# Patient Record
Sex: Female | Born: 1955
Health system: Southern US, Community
[De-identification: ages and names within clinical notes are randomized; demographics above are authoritative.]

## PROBLEM LIST (undated history)

## (undated) DIAGNOSIS — I10 Essential (primary) hypertension: Secondary | ICD-10-CM

## (undated) DIAGNOSIS — T7840XA Allergy, unspecified, initial encounter: Secondary | ICD-10-CM

## (undated) DIAGNOSIS — E559 Vitamin D deficiency, unspecified: Secondary | ICD-10-CM

## (undated) DIAGNOSIS — R7303 Prediabetes: Secondary | ICD-10-CM

## (undated) DIAGNOSIS — R569 Unspecified convulsions: Secondary | ICD-10-CM

## (undated) DIAGNOSIS — M1990 Unspecified osteoarthritis, unspecified site: Secondary | ICD-10-CM

## (undated) DIAGNOSIS — S069XAA Unspecified intracranial injury with loss of consciousness status unknown, initial encounter: Secondary | ICD-10-CM

## (undated) DIAGNOSIS — E785 Hyperlipidemia, unspecified: Secondary | ICD-10-CM

## (undated) DIAGNOSIS — S069X9A Unspecified intracranial injury with loss of consciousness of unspecified duration, initial encounter: Secondary | ICD-10-CM

## (undated) HISTORY — DX: Vitamin D deficiency, unspecified: E55.9

## (undated) HISTORY — DX: Essential (primary) hypertension: I10

## (undated) HISTORY — PX: ABDOMINAL HYSTERECTOMY: SHX81

## (undated) HISTORY — DX: Unspecified intracranial injury with loss of consciousness status unknown, initial encounter: S06.9XAA

## (undated) HISTORY — DX: Prediabetes: R73.03

## (undated) HISTORY — DX: Allergy, unspecified, initial encounter: T78.40XA

## (undated) HISTORY — DX: Hyperlipidemia, unspecified: E78.5

## (undated) HISTORY — DX: Unspecified osteoarthritis, unspecified site: M19.90

## (undated) HISTORY — DX: Unspecified convulsions: R56.9

## (undated) HISTORY — PX: BREAST BIOPSY: SHX20

## (undated) HISTORY — PX: KNEE ARTHROSCOPY: SUR90

## (undated) HISTORY — DX: Unspecified intracranial injury with loss of consciousness of unspecified duration, initial encounter: S06.9X9A

---

## 1998-06-21 ENCOUNTER — Ambulatory Visit (HOSPITAL_COMMUNITY): Admission: RE | Admit: 1998-06-21 | Discharge: 1998-06-21 | Payer: Self-pay | Admitting: Obstetrics and Gynecology

## 2000-02-27 ENCOUNTER — Other Ambulatory Visit: Admission: RE | Admit: 2000-02-27 | Discharge: 2000-02-27 | Payer: Self-pay | Admitting: Obstetrics and Gynecology

## 2000-03-05 ENCOUNTER — Encounter: Payer: Self-pay | Admitting: Obstetrics and Gynecology

## 2000-03-05 ENCOUNTER — Encounter: Admission: RE | Admit: 2000-03-05 | Discharge: 2000-03-05 | Payer: Self-pay | Admitting: Obstetrics and Gynecology

## 2002-09-08 ENCOUNTER — Encounter: Payer: Self-pay | Admitting: Gynecology

## 2002-09-08 ENCOUNTER — Encounter: Admission: RE | Admit: 2002-09-08 | Discharge: 2002-09-08 | Payer: Self-pay | Admitting: Gynecology

## 2002-09-18 ENCOUNTER — Encounter: Payer: Self-pay | Admitting: Gynecology

## 2002-09-18 ENCOUNTER — Encounter: Admission: RE | Admit: 2002-09-18 | Discharge: 2002-09-18 | Payer: Self-pay | Admitting: Gynecology

## 2002-09-22 ENCOUNTER — Other Ambulatory Visit: Admission: RE | Admit: 2002-09-22 | Discharge: 2002-09-22 | Payer: Self-pay | Admitting: Gynecology

## 2003-01-20 ENCOUNTER — Encounter: Payer: Self-pay | Admitting: Gynecology

## 2003-01-20 ENCOUNTER — Encounter: Admission: RE | Admit: 2003-01-20 | Discharge: 2003-01-20 | Payer: Self-pay | Admitting: Gynecology

## 2003-02-22 ENCOUNTER — Encounter: Admission: RE | Admit: 2003-02-22 | Discharge: 2003-02-22 | Payer: Self-pay | Admitting: Neurology

## 2004-02-15 ENCOUNTER — Other Ambulatory Visit: Admission: RE | Admit: 2004-02-15 | Discharge: 2004-02-15 | Payer: Self-pay | Admitting: Gynecology

## 2004-02-16 ENCOUNTER — Encounter: Admission: RE | Admit: 2004-02-16 | Discharge: 2004-02-16 | Payer: Self-pay | Admitting: Gynecology

## 2005-06-05 ENCOUNTER — Other Ambulatory Visit: Admission: RE | Admit: 2005-06-05 | Discharge: 2005-06-05 | Payer: Self-pay | Admitting: Gynecology

## 2005-06-12 ENCOUNTER — Encounter: Admission: RE | Admit: 2005-06-12 | Discharge: 2005-06-12 | Payer: Self-pay | Admitting: Gynecology

## 2005-07-11 ENCOUNTER — Encounter (INDEPENDENT_AMBULATORY_CARE_PROVIDER_SITE_OTHER): Payer: Self-pay | Admitting: Specialist

## 2005-07-11 ENCOUNTER — Inpatient Hospital Stay (HOSPITAL_COMMUNITY): Admission: RE | Admit: 2005-07-11 | Discharge: 2005-07-13 | Payer: Self-pay | Admitting: Gynecology

## 2006-04-09 ENCOUNTER — Encounter: Admission: RE | Admit: 2006-04-09 | Discharge: 2006-04-09 | Payer: Self-pay | Admitting: Gynecology

## 2006-04-24 ENCOUNTER — Encounter: Admission: RE | Admit: 2006-04-24 | Discharge: 2006-04-24 | Payer: Self-pay | Admitting: Gynecology

## 2006-05-03 ENCOUNTER — Encounter: Admission: RE | Admit: 2006-05-03 | Discharge: 2006-05-03 | Payer: Self-pay | Admitting: Gynecology

## 2006-08-14 ENCOUNTER — Ambulatory Visit (HOSPITAL_BASED_OUTPATIENT_CLINIC_OR_DEPARTMENT_OTHER): Admission: RE | Admit: 2006-08-14 | Discharge: 2006-08-14 | Payer: Self-pay | Admitting: Surgery

## 2006-08-14 ENCOUNTER — Emergency Department (HOSPITAL_COMMUNITY): Admission: EM | Admit: 2006-08-14 | Discharge: 2006-08-14 | Payer: Self-pay | Admitting: Emergency Medicine

## 2006-08-14 ENCOUNTER — Encounter (INDEPENDENT_AMBULATORY_CARE_PROVIDER_SITE_OTHER): Payer: Self-pay | Admitting: Specialist

## 2006-10-15 LAB — HM COLONOSCOPY

## 2007-01-08 ENCOUNTER — Other Ambulatory Visit: Admission: RE | Admit: 2007-01-08 | Discharge: 2007-01-08 | Payer: Self-pay | Admitting: Gynecology

## 2007-01-14 ENCOUNTER — Encounter: Admission: RE | Admit: 2007-01-14 | Discharge: 2007-01-14 | Payer: Self-pay | Admitting: Gynecology

## 2008-01-13 HISTORY — PX: COLONOSCOPY: SHX174

## 2008-02-05 ENCOUNTER — Encounter: Admission: RE | Admit: 2008-02-05 | Discharge: 2008-02-05 | Payer: Self-pay | Admitting: Gynecology

## 2008-07-30 ENCOUNTER — Encounter: Admission: RE | Admit: 2008-07-30 | Discharge: 2008-07-30 | Payer: Self-pay | Admitting: Gynecology

## 2009-02-09 ENCOUNTER — Encounter: Admission: RE | Admit: 2009-02-09 | Discharge: 2009-02-09 | Payer: Self-pay | Admitting: Gynecology

## 2009-02-09 ENCOUNTER — Encounter (INDEPENDENT_AMBULATORY_CARE_PROVIDER_SITE_OTHER): Payer: Self-pay | Admitting: Diagnostic Radiology

## 2010-02-28 ENCOUNTER — Encounter: Admission: RE | Admit: 2010-02-28 | Discharge: 2010-02-28 | Payer: Self-pay | Admitting: Internal Medicine

## 2010-11-05 ENCOUNTER — Encounter: Payer: Self-pay | Admitting: Gynecology

## 2011-03-02 NOTE — Op Note (Signed)
[name], [name]               ACCOUNT NO.:  [id number]   MEDICAL RECORD NO.:  [id number]          PATIENT TYPE:  AMB   LOCATION:  DSC                          FACILITY:  [facility]   PHYSICIAN:  Currie Paris, M.D.DATE OF BIRTH:  1956/07/17   DATE OF PROCEDURE:  08/14/2006  DATE OF DISCHARGE:                                 OPERATIVE REPORT   PREOPERATIVE DIAGNOSIS:  Bloody left nipple discharge.   POSTOPERATIVE DIAGNOSIS:  Bloody left nipple discharge.   OPERATION:  Left breast ductal excision.   SURGEON:  Currie Paris, M.D.   ANESTHESIA:  General.   CLINICAL HISTORY:  [name] is a 55 year old lady with a spontaneous  bloody left nipple discharge that came from a duct at about the 9 o'clock  position.  Attempts at ductography were unsuccessful.  It was elected to do  ductal excision to obtain tissue for diagnosis.   DESCRIPTION OF PROCEDURE:  The patient was seen in the holding area and she  had no further questions.  We both marked the left breast as the operative  side.  The patient was taken to the operating room and after satisfactory  general anesthesia had been obtained, the left breast was prepped and  draped.  The time-out occurred.  I was able to get a 0 size probe into the  duct producing the bloody drainage and then a 1 and then a 2 to dilate it a  little bit.  There seemed to be a little bit of obstruction with the two  larger ones just under the nipple proper.  I then put a 23 angiocath in and  injected some methylene blue to try to stain the ductal tissue.   Following this, a curvilinear incision was made in the areolar margin  medially.  I raised a skin flap over to the duct and disconnected the blue  stained duct from the nipple.  Right at that point, there did appeared to be  a little bit of tissue coming out the duct consistent with a tiny papilloma.  I made sure we excised all of this and then the blue dye seemed to track a  little bit deep  medial and inferior so I took the breast tissue that was  stained with blue dye out to complete the excision of this ductal system.  In disconnecting the ducts, we did apparently get all the ducts disconnected  from the nipple.   Once I made sure everything was dry, I injected Marcaine to help with postop  pain relief.  I then closed the breast in layers with 3-0 Vicryl, 4-0  Monocryl subcuticular, and Dermabond.  The patient tolerated the procedure  well.  There were no operative complications.  All counts were correct.      Currie Paris, M.D.  Electronically Signed     CJS/MEDQ  D:  08/14/2006  T:  08/14/2006  Job:  [id number]   cc:   Gretta Cool, M.D.

## 2011-03-02 NOTE — Op Note (Signed)
NAMEALIXANDRA, Horton               ACCOUNT NO.:  [id number]   MEDICAL RECORD NO.:  [id number]          PATIENT TYPE:  INP   LOCATION:  9309                          FACILITY:  [facility]   PHYSICIAN:  Gretta Cool, M.D. DATE OF BIRTH:  October 28, 1955   DATE OF PROCEDURE:  07/11/2005  DATE OF DISCHARGE:                                 OPERATIVE REPORT   PREOPERATIVE DIAGNOSIS:  Massive uterine leiomyomata with abnormal uterine  bleeding.   POSTOPERATIVE DIAGNOSIS:  Massive uterine leiomyomata with abnormal uterine  bleeding.   PROCEDURE:  Supracervical hysterectomy and bilateral salpingo-oophorectomy.   SURGEON:  Dr. Nicholas Lose.   ASSISTANT:  Dr. Jeanine Luz.   ANESTHESIA:  General orotracheal.   DESCRIPTION OF PROCEDURE:  Under excellent general anesthesia with the  patient's abdomen prepped and draped in lithotomy position with PAS  stockings in place, a Pfannenstiel incision was made by excision of her  previous scar.  The incision was then extended through the fascia and the  rectus muscle separated from the fascia, the peritoneum opened and the  abdomen explored.  There are no abnormalities in the upper abdomen  identified.  Examination of the uterus revealed an enormous fibroid-  containing uterus that filled the pelvis, rising up to the umbilicus.  There  were enormous numbers of fibroids extending transmural and pedunculated  fibroids.  The retractors and packs were placed so as to expose the uterus  as well as possible.  The round ligaments were then transected.  The  infundibulopelvic ligaments were then isolated, clamped, cut, sutured, and  tied with 0 Vicryl.  A second free tie was used to double ligate the  infundibulopelvic.  At this point, the uterine vessels were skeletonized.  The uterus was then delivered with a Lahey thyroid clamp through the uterine  incision.  The bladder flap was then incised and the bladder pushed off the  lower segment.  The uterine vessels were  then further skeletonized, then  clamped, cut, sutured, and tied with 0 Vicryl.  The upper portion of  cardinal ligament was then also clamped across, cut, tied, sutured and tied.  At this point, the cervix was incised so as to remove virtually all of the  endocervical canal all the way to the vagina.  The fascia of the cervix was  left intact.  At this point, the cervical fascia was approximated with a  running suture of #0 Vicryl from transversely.  At this point, careful  examination revealed some residual bleeding of adventitial areas in the left  parametrium.  Those were treated by cautery and controlled.  At this point,  the pelvic floor was reperitonealized with a 2-0 chronic catgut.  The packs  and retractors were then removed, and the pelvis was irrigated.  The omentum  was pulled down over the pelvic organs and the abdominal peritoneum closed  with a running suture of #0 Monocryl.  The rectus muscles were then plicated  in the midline with the same suture of 0 Monocryl.  Fascia was then  approximated from each angle to the midline with 0 Vicryl.  The  subcutaneous  tissue was then freed from Scarpa's attachment to the previous old scar so  as to prevent increasingly  severe indentation in the incision.  The subcu was mobilized then closed  with interrupted sutures of 3-0 Vicryl.  Skin was then closed with skin  staples and Steri-Strips.  At the end of the procedure, sponge and lap  counts were correct.  There were no complications.  The patient returned to  the recovery room in excellent condition.           ______________________________  Gretta Cool, M.D.     CWL/MEDQ  D:  07/12/2005  T:  07/12/2005  Job:  [id number]   cc:   Almedia Balls. Randell Patient, M.D.  Fax: [phone]

## 2011-03-02 NOTE — Discharge Summary (Signed)
[name], [name]               ACCOUNT NO.:  [id number]   MEDICAL RECORD NO.:  [id number]          PATIENT TYPE:  INP   LOCATION:  9309                          [facility]ACILITY:  [facility]   PHYSICIAN:  Gretta Cool, M.D. DATE OF BIRTH:  [birth year]   DATE OF ADMISSION:  07/11/2005  DATE OF DISCHARGE:  07/13/2005                                 DISCHARGE SUMMARY   HISTORY OF PRESENT ILLNESS:  [name] is a 55 year old female gravida 2,  para 1, who is having increasingly severe pelvic pressure with discomfort  from an enlarging uterus.  She reports frequency and urgency to void and  nocturia.  She also has prolonged bleeding with her menses. The uterus has  now grown to the size of a 20 week pregnancy at the umbilicus.  This is a  dramatic increase since her last examination.  She has visible distention of  the lower abdomen.  Options were discussed with the patient and she wishes  to proceed with definitive therapy by hysterectomy and salpingo-  oophorectomy.   PHYSICAL EXAMINATION:  On admission her chest was clear to auscultation and  percussion.  Heart rate and rhythm were regular without murmur, gallop or  cardiac enlargement.  Abdomen large palpable mass extending from the  symphysis to the umbilicus with distention of the lower abdomen.  There are  no other pelvic masses palpable.  Pelvic examination: External genitalia  within normal limits for female.  Vagina clean and rugose with excellent  support.  Cervix is nulliparous in appearance, pulled high into the pelvis.  There is a dramatic lobular knobby uterus with multiple fibroids palpable.  It fills the entire pelvis and rises to the umbilicus.  Adnexa nonpalpable.  Rectovaginal examination confirms.   IMPRESSION:  1.  Uterine leiomyomata with massive enlargement.  2.  Abnormal uterine bleeding.  3.  Pelvic pressure with incontinence.  4.  Perimenopausal.   PLAN:  Supracervical hysterectomy, bilateral salpingo-oophorectomy  under  general anesthesia.  Risks and benefits were discussed with the patient and  she accepts these procedures.   LABORATORY DATA:  Admission hemoglobin 13.3, hematocrit 39.3.  On the first  postoperative day hemoglobin was 12.6, hematocrit 37.2.  Pathology report  revealed (1) endometrium proliferative with benign endometrial polyps, no  hyperplasia or carcinoma identified; adenomyosis, leiomyomata submucosal,  intramural and subserosal; right and left ovary benign follicular cyst; no  endometriosis or malignancy noted; benign right and left tubes.   HOSPITAL COURSE:  The patient underwent supracervical hysterectomy and  bilateral salpingo-oophorectomy under general anesthesia.  The procedures  were completed without any complications and the patient was returned to the  recovery room in excellent condition.  Her postoperative course was without  complications and she was discharged on the second postoperative day in  excellent condition.   FINAL DISCHARGE INSTRUCTIONS:  Included no heavy lifting or straining, no  vaginal entrance, increase ambulation as tolerated.  She is to call if any  fever over 100.5 or failure of daily improvement.   DIET:  Regular.   MEDICATIONS:  1.  Tylox one tablet p.o. q.4h. PRN  pain.  2.  Celebrex 200 mg daily X5 days.   FOLLOW UP:  She is to return to the office in one week for follow up.   CONDITION ON DISCHARGE:  Excellent.   FINAL DISCHARGE DIAGNOSES:  1.  Massive uterine leiomyomata with abnormal bleeding.  2.  Adenomyosis.  3.  The leiomyomata were submucosal, intramural and subserosal.   PROCEDURES PERFORMED:  Supracervical hysterectomy, bilateral salpingo-  oophorectomy under general anesthesia.      Matt Holmes, N.P.    ______________________________  Gretta Cool, M.D.    EMK/MEDQ  D:  09/05/2005  T:  09/05/2005  Job:  [id number]

## 2011-03-02 NOTE — H&P (Signed)
[name], [name]               ACCOUNT NO.:  [id number]   MEDICAL RECORD NO.:  [id number]          PATIENT TYPE:  INP   LOCATION:  9309                          FACILITY:  [facility]   PHYSICIAN:  Gretta Cool, M.D. DATE OF BIRTH:  05-19-1956   DATE OF ADMISSION:  07/11/2005  DATE OF DISCHARGE:                                HISTORY & PHYSICAL   CHIEF COMPLAINT:  1.  Abnormal uterine bleeding from fibroid uterus.  2.  Perimenopausal night sweats, hot flushes, skips of menses.   HISTORY OF PRESENT ILLNESS:  This 55 year old gravida 2, para 1 who is not  sexually active but having increasing difficulty with pelvic pressure,  discomfort enlarging uterus now off to the umbilicus with frequency and  urgency to void, nocturia also having erratic menses with prolonged flow  varying to no bleeding.  She now has a uterus that has grown to the  umbilicus. It has increased dramatically since her exam last.  She has  visible distension of her lower abdomen.  We have discussed options and  alternatives all.  She wishes to proceed to definitive therapy by  hysterectomy and salpingo-oophorectomy.  She understands the risks and  benefits of removal of the ovaries, options and all.   PAST MEDICAL HISTORY:  Usual childhood disease without sequelae.   MEDICAL ILLNESSES:  History of D&C by Dr. Katrinka Blazing for abnormal uterine  bleeding.  History of cesarean section, childbirth, by Dr. Jennette Kettle in 1987.  No  hospitalizations.   PRESENT MEDICATIONS:  None.   ALLERGIES:  None known.   FAMILY HISTORY:  Brother died of metastatic cancer, unknown primary.  One  brother has renal failure. Mother has diabetes type 2 and hypertension.  No  other known familial tendency.   SOCIAL HISTORY:  The patient is divorced and not in a relationship now.  She  has one child living at home, age 55.   REVIEW OF SYSTEMS:  HEENT: Denies symptoms.  CARDIORESPIRATORY:  Denies any cough or shortness of breath.  GASTROINTESTINAL/GENITOURINARY:  Denies frequency, urgency, dysuria, change  in bowel habits, or food intolerance.   PHYSICAL EXAMINATION:  GENERAL:  Well-developed, short black female.  HEENT: Pupils equal, react to light and accommodation.  Fundi benign.  Oropharynx clear.  NECK:  Supple without masses or thyroid enlargement.  CHEST:  Clear to percussion and auscultation.  BREASTS:  Without mass, nodes or nipple discharge.  HEART:  Regular rhythm without murmur or cardiac enlargement.  ABDOMEN:  Large, palpable mass extending from symphysis to the umbilicus  with distension of her lower abdomen.  There are no other pelvic masses or  abdominal masses palpable.  PELVIC:  External genitalia normal female.  Vagina clean, rugose with  excellent nulliparous support.  Cervix is nulliparous in appearance pulled  up high out of the pelvis.  She has a dramatically lobular, knobby uterus  with multiple fibroids palpable in all planes. It fills her entire pelvis  and rises to the umbilicus.  Adnexa nonpalpable.  Rectovaginal exam  confirms.  EXTREMITIES:  Negative.  NEUROLOGICAL:  Physiologic.   IMPRESSION:  1.  Uterine leiomyomata with massive enlargement.  2.  Abnormal uterine bleeding.  3.  Perimenopausal.   PLAN:  Supracervical hysterectomy, bilateral salpingo-oophorectomy.           ______________________________  Gretta Cool, M.D.     CWL/MEDQ  D:  07/12/2005  T:  07/12/2005  Job:  [id number]

## 2011-06-06 ENCOUNTER — Other Ambulatory Visit: Payer: Self-pay | Admitting: Internal Medicine

## 2011-06-06 DIAGNOSIS — Z1231 Encounter for screening mammogram for malignant neoplasm of breast: Secondary | ICD-10-CM

## 2011-06-08 ENCOUNTER — Ambulatory Visit
Admission: RE | Admit: 2011-06-08 | Discharge: 2011-06-08 | Disposition: A | Discharge status: Home / Self Care | Payer: BC Managed Care – PPO | Source: Ambulatory Visit | Attending: Internal Medicine | Admitting: Internal Medicine

## 2011-06-08 DIAGNOSIS — Z1231 Encounter for screening mammogram for malignant neoplasm of breast: Secondary | ICD-10-CM

## 2011-06-26 ENCOUNTER — Other Ambulatory Visit: Payer: Self-pay | Admitting: Gynecology

## 2012-02-29 ENCOUNTER — Other Ambulatory Visit: Payer: Self-pay | Admitting: Gynecology

## 2012-05-12 ENCOUNTER — Other Ambulatory Visit: Payer: Self-pay | Admitting: Gynecology

## 2012-05-12 DIAGNOSIS — Z1231 Encounter for screening mammogram for malignant neoplasm of breast: Secondary | ICD-10-CM

## 2012-06-09 ENCOUNTER — Ambulatory Visit
Admission: RE | Admit: 2012-06-09 | Discharge: 2012-06-09 | Disposition: A | Discharge status: Home / Self Care | Payer: BC Managed Care – PPO | Source: Ambulatory Visit | Attending: Gynecology | Admitting: Gynecology

## 2012-06-09 DIAGNOSIS — Z1231 Encounter for screening mammogram for malignant neoplasm of breast: Secondary | ICD-10-CM

## 2013-05-06 ENCOUNTER — Other Ambulatory Visit: Payer: Self-pay

## 2013-05-06 DIAGNOSIS — Z1231 Encounter for screening mammogram for malignant neoplasm of breast: Secondary | ICD-10-CM

## 2013-06-10 ENCOUNTER — Ambulatory Visit
Admission: RE | Admit: 2013-06-10 | Discharge: 2013-06-10 | Disposition: A | Discharge status: Home / Self Care | Payer: BC Managed Care – PPO | Source: Ambulatory Visit

## 2013-06-10 DIAGNOSIS — Z1231 Encounter for screening mammogram for malignant neoplasm of breast: Secondary | ICD-10-CM

## 2013-06-10 LAB — HM MAMMOGRAPHY: HM Mammogram: NORMAL

## 2013-09-16 ENCOUNTER — Other Ambulatory Visit: Payer: Self-pay | Admitting: Physician Assistant

## 2013-10-04 DIAGNOSIS — I10 Essential (primary) hypertension: Secondary | ICD-10-CM | POA: Insufficient documentation

## 2013-10-04 DIAGNOSIS — E785 Hyperlipidemia, unspecified: Secondary | ICD-10-CM | POA: Insufficient documentation

## 2013-10-04 DIAGNOSIS — E559 Vitamin D deficiency, unspecified: Secondary | ICD-10-CM | POA: Insufficient documentation

## 2013-10-04 DIAGNOSIS — S069X9A Unspecified intracranial injury with loss of consciousness of unspecified duration, initial encounter: Secondary | ICD-10-CM | POA: Insufficient documentation

## 2013-10-04 DIAGNOSIS — S069XAA Unspecified intracranial injury with loss of consciousness status unknown, initial encounter: Secondary | ICD-10-CM | POA: Insufficient documentation

## 2013-10-06 ENCOUNTER — Encounter: Payer: Self-pay | Admitting: Physician Assistant

## 2013-10-06 ENCOUNTER — Ambulatory Visit (INDEPENDENT_AMBULATORY_CARE_PROVIDER_SITE_OTHER): Payer: Self-pay | Admitting: Physician Assistant

## 2013-10-06 VITALS — BP 122/78 | HR 76 | Temp 98.2°F | Resp 16 | Ht 62.0 in | Wt 137.0 lb

## 2013-10-06 DIAGNOSIS — I10 Essential (primary) hypertension: Secondary | ICD-10-CM

## 2013-10-06 DIAGNOSIS — E559 Vitamin D deficiency, unspecified: Secondary | ICD-10-CM

## 2013-10-06 DIAGNOSIS — Z0000 Encounter for general adult medical examination without abnormal findings: Secondary | ICD-10-CM

## 2013-10-06 DIAGNOSIS — Z79899 Other long term (current) drug therapy: Secondary | ICD-10-CM

## 2013-10-06 DIAGNOSIS — R109 Unspecified abdominal pain: Secondary | ICD-10-CM

## 2013-10-06 DIAGNOSIS — H6120 Impacted cerumen, unspecified ear: Secondary | ICD-10-CM

## 2013-10-06 DIAGNOSIS — E785 Hyperlipidemia, unspecified: Secondary | ICD-10-CM

## 2013-10-06 DIAGNOSIS — H6121 Impacted cerumen, right ear: Secondary | ICD-10-CM

## 2013-10-06 LAB — CBC WITH DIFFERENTIAL/PLATELET
Basophils Absolute: 0 10*3/uL (ref 0.0–0.1)
Basophils Relative: 0 % (ref 0–1)
HCT: 41.1 % (ref 36.0–46.0)
Lymphocytes Relative: 44 % (ref 12–46)
MCHC: 33.3 g/dL (ref 30.0–36.0)
Neutro Abs: 1.2 10*3/uL — ABNORMAL LOW (ref 1.7–7.7)
Neutrophils Relative %: 43 % (ref 43–77)
Platelets: 167 10*3/uL (ref 150–400)
RDW: 13.1 % (ref 11.5–15.5)
WBC: 2.9 10*3/uL — ABNORMAL LOW (ref 4.0–10.5)

## 2013-10-06 LAB — HEPATIC FUNCTION PANEL
Alkaline Phosphatase: 87 U/L (ref 39–117)
Indirect Bilirubin: 0.7 mg/dL (ref 0.0–0.9)
Total Bilirubin: 0.9 mg/dL (ref 0.3–1.2)
Total Protein: 7.8 g/dL (ref 6.0–8.3)

## 2013-10-06 LAB — LIPID PANEL
HDL: 72 mg/dL (ref >39)
LDL Cholesterol: 84 mg/dL (ref 0–99)
Total CHOL/HDL Ratio: 2.3 Ratio
Triglycerides: 58 mg/dL (ref <150)

## 2013-10-06 LAB — IRON AND TIBC: %SAT: 39 % (ref 20–55)

## 2013-10-06 LAB — BASIC METABOLIC PANEL WITH GFR
BUN: 17 mg/dL (ref 6–23)
Chloride: 102 mEq/L (ref 96–112)
GFR, Est African American: >89 mL/min
GFR, Est Non African American: >89 mL/min
Potassium: 3.9 mEq/L (ref 3.5–5.3)
Sodium: 141 mEq/L (ref 135–145)

## 2013-10-06 LAB — TSH: TSH: 1.483 u[IU]/mL (ref 0.350–4.500)

## 2013-10-06 LAB — FERRITIN: Ferritin: 177 ng/mL (ref 10–291)

## 2013-10-06 NOTE — Patient Instructions (Addendum)
VAGINAL DRYNESS OVERVIEW  Vaginal dryness, also known as atrophic vaginitis, is a common condition in postmenopausal women. This condition is also common in women who have had both ovaries removed at the time of hysterectomy.   Some women have uncomfortable symptoms of vaginal dryness, such as pain with sex, burning vaginal discomfort or itching, or abnormal vaginal discharge, while others have no symptoms at all.  VAGINAL DRYNESS CAUSES   Estrogen helps to keep the vagina moist and to maintain thickness of the vaginal lining. Vaginal dryness occurs when the ovaries produce a decreased amount of estrogen. This can occur at certain times in a woman's life, and may be permanent or temporary. Times when less estrogen is made include: ?At the time of menopause. ?After surgical removal of the ovaries, chemotherapy, or radiation therapy of the pelvis for cancer. ?After having a baby, particularly in women who breastfeed. ?While using certain medications, such as danazol, medroxyprogesterone (brand names: Provera or DepoProvera), leuprolide (brand name: Lupron), or nafarelin. When these medications are stopped, estrogen production resumes.  Women who smoke cigarettes have been shown to have an increased risk of an earlier menopause transition as compared to non-smokers. Therefore, atrophic vaginitis symptoms may appear at a younger age in this population.  VAGINAL DRYNESS TREATMENT   There are three treatment options for women with vaginal dryness:  Vaginal lubricants and moisturizers - Vaginal lubricants and moisturizers can be purchased without a prescription. These products do not contain any hormones and have virtually no side effects. - Albolene is found in the facial cleanser section at [facility], [facility], or [employer]. It is a large jar with a blue top. This is the best lubricant for women because it is hypoallergenic. -Natural lubricants, such as olive, avocado or peanut oil, are easily available  products that may be used as a lubricant with sex.  -Vaginal moisturizes (eg, Replens, Moist Again, Vagisil, K-Y Silk-E, and Feminease) are formulated to allow water to be retained in the vaginal tissues. Moisturizers are applied into the vagina three times weekly to allow a continued moisturizing effect. These should not be used just before having sex, as they can be irritating.  Vaginal estrogen - Vaginal estrogen is the most effective treatment option for women with vaginal dryness. Vaginal estrogen must be prescribed by a healthcare provider. Very low doses of vaginal estrogen can be used when it is put into the vagina to treat vaginal dryness. A small amount of estrogen is absorbed into the bloodstream, but only about 100 times less than when using estrogen pills or tablets. As a result, there is a much lower risk of side effects, such as blood clots, breast cancer, and heart attack, compared with other estrogen-containing products (birth control pills, menopausal hormone therapy).  Ospemifene - Ospemifene is a prescription medication that is similar to estrogen, but is not estrogen. In the vaginal tissue, it acts similarly to estrogen. In the breast tissue, it acts as an estrogen blocker. It comes in a pill, and is prescribed for women who want to use an estrogen-like medication for vaginal dryness or painful sex associated with vaginal dryness, but prefer not to use a vaginal medication. The medication may cause hot flashes as a side effect. This type of medication may increase the risk of blood clots or uterine cancer. Further study of ospemifene is needed to evaluate the risk of these complications. This medication has not been tested in women who have had breast cancer or are at a high risk of developing breast  cancer.   Sexual activity - Vaginal estrogen improves vaginal dryness quickly, usually within a few weeks. You may continue to have sex as you treat vaginal dryness because sex itself can  help to keep the vaginal tissues healthy. Vaginal intercourse may help the vaginal tissues by keeping them soft and stretchable and preventing the tissues from shrinking.  If sex continues to be painful despite treatment for vaginal dryness, talk to your healthcare provider.     Bad carbs also include fruit juice, alcohol, and sweet tea. These are empty calories that do not signal to your brain that you are full.   Please remember the good carbs are still carbs which convert into sugar. So please measure them out no more than 1/2-1 cup of rice, oatmeal, pasta, and beans.  Veggies are however free foods! Pile them on.   I like lean protein at every meal such as chicken, [place], pork chops, cottage cheese, etc. Just do not fry these meats and please center your meal around vegetable, the meats should be a side dish.   No all fruit is created equal. Please see the list below, the fruit at the bottom is higher in sugars than the fruit at the top   Cholesterol Cholesterol is a white, waxy, fat-like protein needed by your body in small amounts. The liver makes all the cholesterol you need. It is carried from the liver by the blood through the blood vessels. Deposits (plaque) may build up on blood vessel walls. This makes the arteries narrower and stiffer. Plaque increases the risk for heart attack and stroke. You cannot feel your cholesterol level even if it is very high. The only way to know is by a blood test to check your lipid (fats) levels. Once you know your cholesterol levels, you should keep a record of the test results. Work with your caregiver to to keep your levels in the desired range. WHAT THE RESULTS MEAN:  Total cholesterol is a rough measure of all the cholesterol in your blood.  LDL is the so-called bad cholesterol. This is the type that deposits cholesterol in the walls of the arteries. You want this level to be low.  HDL is the good cholesterol because it cleans the arteries and  carries the LDL away. You want this level to be high.  Triglycerides are fat that the body can either burn for energy or store. High levels are closely linked to heart disease. DESIRED LEVELS:  Total cholesterol below 200.  LDL below 100 for people at risk, below 70 for very high risk.  HDL above 50 is good, above 60 is best.  Triglycerides below 150. HOW TO LOWER YOUR CHOLESTEROL:  Diet.  Choose fish or white meat chicken and [place], roasted or baked. Limit fatty cuts of red meat, fried foods, and processed meats, such as sausage and lunch meat.  Eat lots of fresh fruits and vegetables. Choose whole grains, beans, pasta, potatoes and cereals.  Use only small amounts of olive, corn or canola oils. Avoid butter, mayonnaise, shortening or palm kernel oils. Avoid foods with trans-fats.  Use skim/nonfat milk and low-fat/nonfat yogurt and cheeses. Avoid whole milk, cream, ice cream, egg yolks and cheeses. Healthy desserts include angel food cake, ginger snaps, animal crackers, hard candy, popsicles, and low-fat/nonfat frozen yogurt. Avoid pastries, cakes, pies and cookies.  Exercise.  A regular program helps decrease LDL and raises HDL.  Helps with weight control.  Do things that increase your activity level like gardening, walking,  or taking the stairs.  Medication.  May be prescribed by your caregiver to help lowering cholesterol and the risk for heart disease.  You may need medicine even if your levels are normal if you have several risk factors. HOME CARE INSTRUCTIONS   Follow your diet and exercise programs as suggested by your caregiver.  Take medications as directed.  Have blood work done when your caregiver feels it is necessary. MAKE SURE YOU:   Understand these instructions.  Will watch your condition.  Will get help right away if you are not doing well or get worse. Document Released: 06/26/2001 Document Revised: 12/24/2011 Document Reviewed:  12/17/2007 [facility] Patient Information 2014 [place], [place].

## 2013-10-06 NOTE — Progress Notes (Signed)
Complete Physical HPI Patient presents for complete physical.   Patient's blood pressure has been controlled at home. Patient denies chest pain, shortness of breath, dizziness. BP: 122/78 mmHg  She states intermittent palps/double beats, in the past month, last seconds, nonexertional/random, no accompaniments.  Patient's cholesterol is diet controlled.  The patient's cholesterol last visit was LDL was 79.  The patient has been working on diet and exercise for prediabetes, denies changes in vision, polys, and paresthesias. Last A1C in office was 6.3.  Vitamin D was 82.  Patient states she has painful intercourse and states she was treated for vaginal atrophy in the past. She also states she has some abdominal cramping during intercourse and intermittently at her umbilicus.  She has full feeling in her right ear and decreased hearing.  Current Medications:  Current Outpatient Prescriptions on File Prior to Visit  Medication Sig Dispense Refill  . Cholecalciferol (VITAMIN D PO) Take 5,000 Int'l Units by mouth daily.      . Cyanocobalamin (B-12 PO) Take 2,500 mcg by mouth daily.      Marland Kitchen losartan-hydrochlorothiazide (HYZAAR) 100-12.5 MG per tablet take 1 tablet by mouth once daily  30 tablet  1  . MAGNESIUM PO Take 250 mg by mouth daily.       No current facility-administered medications on file prior to visit.   Health Maintenance:  Tetanus: 2012 Pneumovax: 2013 Flu vaccine: 07/16/2013 Zostavax: N/A Pap: 2013 normal- due 3 years MGM: 05/2013 normal DEXA: NA Colonoscopy: 2008 due 10 years EGD: N/A  Allergies: No Known Allergies Medical History:  Past Medical History  Diagnosis Date  . Hyperlipidemia   . Hypertension   . Prediabetes   . Vitamin D deficiency   . TBI (traumatic brain injury)     Memory issues    Surgical History:  Past Surgical History  Procedure Laterality Date  . Abdominal hysterectomy      BSO  . Knee arthroscopy Right    Family History:  Family History   Problem Relation Age of Onset  . Hypertension Mother   . Diabetes Mother   . Heart disease Father    Social History:  History   Social History  . Marital Status: Divorced    Spouse Name: N/A    Number of Children: N/A  . Years of Education: N/A   Occupational History  . Not on file.   Social History Main Topics  . Smoking status: Never Smoker   . Smokeless tobacco: Never Used  . Alcohol Use: No  . Drug Use: No  . Sexual Activity: Not on file   Other Topics Concern  . Not on file   Social History Narrative  . No narrative on file   ROS Constitutional: Denies weight loss/gain, headaches, insomnia, fatigue, night sweats, and change in appetite. Eyes: Bifocals Sept 2014 neg glacoma Denies redness, blurred vision, diplopia, discharge, itchy, watery eyes.  ENT: Denies discharge, congestion, post nasal drip, sore throat, earache, hearing loss, dental pain, Tinnitus, Vertigo, Sinus pain, snoring.  Cardio: + palpitations  Denies chest pain, irregular heartbeat, dyspnea, diaphoresis, orthopnea, PND, claudication, edema Respiratory: denies cough, dyspnea, pleurisy, hoarseness, wheezing.  Gastrointestinal: (Medoff) Denies dysphagia, heartburn, pain, cramps, nausea, vomiting, bloating, diarrhea, constipation, hematemesis, melena, hematochezia, hemorrhoids Genitourinary: ([place]) Denies dysuria, frequency, urgency, nocturia, hesitancy, discharge, hematuria, flank pain Breast: Denies Breast lumps, nipple discharge, bleeding.  Musculoskeletal: Denies arthralgia, myalgia, stiffness, Jt. Swelling, pain, Skin: Denies pruritis, rash, hives,  acne, eczema, changing in skin lesion Neuro: Hyacinth Meeker) Denies Weakness, tremor,  incoordination, spasms, paresthesia, pain Psychiatric: Denies confusion, memory loss, sensory loss Endocrine: Denies change in weight, skin, hair change, nocturia, and paresthesia, Diabetic Denies Polys, visual blurring, hyper /hypo glycemic episodes.  Heme/Lymph: Denies  Excessive bleeding, bruising, enlarged lymph nodes  Physical Exam: Estimated body mass index is 25.05 kg/(m^2) as calculated from the following:   Height as of this encounter: 5\' 2"  (1.575 m).   Weight as of this encounter: 137 lb (62.143 kg). Filed Vitals:   10/06/13 1016  BP: 122/78  Pulse: 76  Temp: 98.2 F (36.8 C)  Resp: 16   General Appearance: Well nourished, in no apparent distress. Eyes: PERRLA, EOMs, conjunctiva no swelling or erythema, normal fundi and vessels. Sinuses: No Frontal/maxillary tenderness ENT/Mouth: Ext aud canals clear on left with dry, hard cerumen impaction on the right, normal light reflex with TMs without erythema, bulging.  Good dentition. No erythema, swelling, or exudate on post pharynx. Tonsils not swollen or erythematous. Hearing normal.  Neck: Supple, thyroid normal. No bruits Respiratory: Respiratory effort normal, BS equal bilaterally without rales, rhonchi, wheezing or stridor. Cardio: RRR without murmurs, rubs or gallops. Brisk peripheral pulses without edema.  Chest: symmetric, with normal excursions and percussion. Breasts: Symmetric, without lumps, nipple discharge, retractions. Abdomen: Soft, +BS. + umbilical tenderness no guarding, rebound, hernias, masses, or organomegaly. .  Lymphatics: Non tender without lymphadenopathy.  Genitourinary: defer Musculoskeletal: Full ROM all peripheral extremities,5/5 strength, and normal gait. Skin: Warm, dry without rashes, lesions, ecchymosis.  Neuro: Cranial nerves intact, reflexes equal bilaterally. Normal muscle tone, no cerebellar symptoms. Sensation intact.  Psych: Awake and oriented X 3, normal affect, Insight and Judgment appropriate.   EKG: PRWP, WNL no changes.  Assessment and Plan: Hyperlipidemia- check lipids  Hypertension- at goal, continue to monitor  Prediabetes- diet and weight loss discussed- check A1C  Vitamin D deficiency- check vitamin d level  TBI (traumatic brain injury)- at  baseline  Cerumen impaction with removal of cerumen from right ear Palpiations- check EKG, labs, no accompaniments- will monitor periumbilical tenderness- no rebound, brother with history of cancer will get U/S AB Vaginal atrophy try albolene and if it gets worse can do estrogen cream- can also see OB/GYN.  Discussed med's effects and SE's. Screening labs and tests as requested with regular follow-up as recommended.   Quentin Mulling 10:28 AM

## 2013-10-07 LAB — URINALYSIS, ROUTINE W REFLEX MICROSCOPIC
Bilirubin Urine: NEGATIVE
Nitrite: NEGATIVE
Specific Gravity, Urine: 1.018 (ref 1.005–1.030)
Urobilinogen, UA: 0.2 mg/dL (ref 0.0–1.0)
pH: 6.5 (ref 5.0–8.0)

## 2013-10-07 LAB — MICROALBUMIN / CREATININE URINE RATIO
Creatinine, Urine: 230 mg/dL
Microalb Creat Ratio: 5.3 mg/g (ref 0.0–30.0)
Microalb, Ur: 1.23 mg/dL (ref 0.00–1.89)

## 2013-10-07 LAB — URINALYSIS, MICROSCOPIC ONLY
Casts: NONE SEEN
Crystals: NONE SEEN

## 2013-10-07 LAB — INSULIN, FASTING: Insulin fasting, serum: 7 u[IU]/mL (ref 3–28)

## 2013-10-07 LAB — VITAMIN D 25 HYDROXY (VIT D DEFICIENCY, FRACTURES): Vit D, 25-Hydroxy: 75 ng/mL (ref 30–89)

## 2013-10-13 ENCOUNTER — Other Ambulatory Visit: Payer: Self-pay | Admitting: Physician Assistant

## 2013-10-13 ENCOUNTER — Ambulatory Visit (HOSPITAL_COMMUNITY)
Admission: RE | Admit: 2013-10-13 | Discharge: 2013-10-13 | Disposition: A | Discharge status: Home / Self Care | Payer: BC Managed Care – PPO | Source: Ambulatory Visit | Attending: Physician Assistant | Admitting: Physician Assistant

## 2013-10-13 DIAGNOSIS — R1033 Periumbilical pain: Secondary | ICD-10-CM | POA: Insufficient documentation

## 2013-10-13 DIAGNOSIS — R109 Unspecified abdominal pain: Secondary | ICD-10-CM

## 2013-11-07 ENCOUNTER — Other Ambulatory Visit: Payer: Self-pay | Admitting: Physician Assistant

## 2014-01-07 ENCOUNTER — Ambulatory Visit: Payer: Medicare Other | Admitting: Physician Assistant

## 2014-02-17 ENCOUNTER — Ambulatory Visit: Payer: Medicare Other | Admitting: Physician Assistant

## 2014-02-27 ENCOUNTER — Other Ambulatory Visit: Payer: Self-pay | Admitting: Physician Assistant

## 2014-03-11 ENCOUNTER — Encounter: Payer: Self-pay | Admitting: Physician Assistant

## 2014-03-11 ENCOUNTER — Ambulatory Visit (INDEPENDENT_AMBULATORY_CARE_PROVIDER_SITE_OTHER): Payer: Medicare Other | Admitting: Physician Assistant

## 2014-03-11 VITALS — BP 132/72 | HR 76 | Temp 98.1°F | Resp 16 | Ht 62.0 in | Wt 138.0 lb

## 2014-03-11 DIAGNOSIS — R7309 Other abnormal glucose: Secondary | ICD-10-CM

## 2014-03-11 DIAGNOSIS — Z79899 Other long term (current) drug therapy: Secondary | ICD-10-CM | POA: Diagnosis not present

## 2014-03-11 DIAGNOSIS — I10 Essential (primary) hypertension: Secondary | ICD-10-CM

## 2014-03-11 DIAGNOSIS — E785 Hyperlipidemia, unspecified: Secondary | ICD-10-CM | POA: Diagnosis not present

## 2014-03-11 DIAGNOSIS — E559 Vitamin D deficiency, unspecified: Secondary | ICD-10-CM | POA: Diagnosis not present

## 2014-03-11 DIAGNOSIS — R7303 Prediabetes: Secondary | ICD-10-CM

## 2014-03-11 LAB — CBC WITH DIFFERENTIAL/PLATELET
Basophils Absolute: 0 10*3/uL (ref 0.0–0.1)
Basophils Relative: 0 % (ref 0–1)
Eosinophils Absolute: 0.1 10*3/uL (ref 0.0–0.7)
Eosinophils Relative: 2 % (ref 0–5)
HCT: 38.1 % (ref 36.0–46.0)
HEMOGLOBIN: 12.8 g/dL (ref 12.0–15.0)
LYMPHS ABS: 1.5 10*3/uL (ref 0.7–4.0)
Lymphocytes Relative: 41 % (ref 12–46)
MCH: 30.7 pg (ref 26.0–34.0)
MCHC: 33.6 g/dL (ref 30.0–36.0)
MCV: 91.4 fL (ref 78.0–100.0)
MONOS PCT: 8 % (ref 3–12)
Monocytes Absolute: 0.3 10*3/uL (ref 0.1–1.0)
NEUTROS ABS: 1.8 10*3/uL (ref 1.7–7.7)
NEUTROS PCT: 49 % (ref 43–77)
PLATELETS: 159 10*3/uL (ref 150–400)
RBC: 4.17 MIL/uL (ref 3.87–5.11)
RDW: 13.1 % (ref 11.5–15.5)
WBC: 3.7 10*3/uL — AB (ref 4.0–10.5)

## 2014-03-11 LAB — HEMOGLOBIN A1C
Hgb A1c MFr Bld: 6 % — ABNORMAL HIGH (ref <5.7)
MEAN PLASMA GLUCOSE: 126 mg/dL — AB (ref <117)

## 2014-03-11 NOTE — Progress Notes (Signed)
Assessment and Plan:  Hypertension: Continue medication, monitor blood pressure at home. Continue DASH diet. Cholesterol: Continue diet and exercise. Check cholesterol.  Pre-diabetes-Continue diet and exercise. Check A1C Vitamin D Def- check level and continue medications.  Nonproductive cough- take claritin, take priolsec and can do nasonex at night.   Continue diet and meds as discussed. Further disposition pending results of labs.  HPI 58 y.o. female  presents for 3 month follow up with hypertension, hyperlipidemia, prediabetes and vitamin D. Her blood pressure has been controlled at home, today their BP is BP: 132/72 mmHg She does workout, she will go to the mall and walk. She denies chest pain, shortness of breath, dizziness.  She is not on cholesterol medication and denies myalgias. Her cholesterol is at goal. The cholesterol last visit was:   Lab Results  Component Value Date   CHOL 168 10/06/2013   HDL 72 10/06/2013   LDLCALC 84 10/06/2013   TRIG 58 10/06/2013   CHOLHDL 2.3 10/06/2013   She has been working on diet and exercise for prediabetes, and denies paresthesia of the feet, polydipsia and polyuria. Last A1C in the office was:  Lab Results  Component Value Date   HGBA1C 6.2* 10/06/2013   Patient is on Vitamin D supplement.   She complains of a nonproductive cough, worse at night and worse with lying on her back. No CP, SOB, cough, wheezing with walking. She has some post nasal drip/congestion.   Current Medications:  Current Outpatient Prescriptions on File Prior to Visit  Medication Sig Dispense Refill  . Cholecalciferol (VITAMIN D PO) Take 5,000 Int'l Units by mouth daily.      . Cyanocobalamin (B-12 PO) Take 2,500 mcg by mouth daily.      Marland Kitchen losartan-hydrochlorothiazide (HYZAAR) 100-12.5 MG per tablet take 1 tablet by mouth once daily  30 tablet  0  . MAGNESIUM PO Take 250 mg by mouth daily.       No current facility-administered medications on file prior to  visit.   Medical History:  Past Medical History  Diagnosis Date  . Hyperlipidemia   . Hypertension   . Prediabetes   . Vitamin D deficiency   . TBI (traumatic brain injury)     Memory issues    Allergies: No Known Allergies   Review of Systems: [X]  = complains of  [ ]  = denies  General: Fatigue [ ]  Fever [ ]  Chills [ ]  Weakness [ ]   Insomnia [ ]  Eyes: Redness [ ]  Blurred vision [ ]  Diplopia [ ]   ENT: Congestion [ ]  Sinus Pain [ ]  Post Nasal Drip [ ]  Sore Throat [ ]  Earache [ ]   Cardiac: Chest pain/pressure [ ]  SOB [ ]  Orthopnea [ ]   Palpitations [ ]   Paroxysmal nocturnal dyspnea[ ]  Claudication [ ]  Edema [ ]   Pulmonary: Cough [ X] Wheezing[ ]   SOB [ ]   Snoring [ ]   GI: Nausea [ ]  Vomiting[ ]  Dysphagia[ ]  Heartburn[ ]  Abdominal pain [ ]  Constipation [ ] ; Diarrhea [ ] ; BRBPR [ ]  Melena[ ]  GU: Hematuria[ ]  Dysuria [ ]  Nocturia[ ]  Urgency [ ]   Hesitancy [ ]  Discharge [ ]  Neuro: Headaches[ ]  Vertigo[ ]  Paresthesias[ ]  Spasm [ ]  Speech changes [ ]  Incoordination [ ]   Ortho: Arthritis [ ]  Joint pain [ ]  Muscle pain [ ]  Joint swelling [ ]  Back Pain [ ]  Skin:  Rash [ ]   Pruritis [ ]  Change in skin lesion [ ]   Psych: Depression[ ]  Anxiety[ ]   Confusion [ ]  Memory loss [ ]   Heme/Lypmh: Bleeding [ ]  Bruising [ ]  Enlarged lymph nodes [ ]   Endocrine: Visual blurring [ ]  Paresthesia [ ]  Polyuria [ ]  Polydypsea [ ]    Heat/cold intolerance [ ]  Hypoglycemia [ ]   Family history- Review and unchanged Social history- Review and unchanged Physical Exam: BP 132/72  Pulse 76  Temp(Src) 98.1 F (36.7 C)  Resp 16  Ht 5\' 2"  (1.575 m)  Wt 138 lb (62.596 kg)  BMI 25.23 kg/m2 Wt Readings from Last 3 Encounters:  03/11/14 138 lb (62.596 kg)  10/06/13 137 lb (62.143 kg)   General Appearance: Well nourished, in no apparent distress. Eyes: PERRLA, EOMs, conjunctiva no swelling or erythema Sinuses: No Frontal/maxillary tenderness ENT/Mouth: Ext aud canals clear, TMs without erythema, bulging. No  erythema, swelling, or exudate on post pharynx.  Tonsils not swollen or erythematous. Hearing normal.  Neck: Supple, thyroid normal.  Respiratory: Respiratory effort normal, BS equal bilaterally without rales, rhonchi, wheezing or stridor.  Cardio: RRR with no MRGs. Brisk peripheral pulses without edema.  Abdomen: Soft, + BS.  Non tender, no guarding, rebound, hernias, masses. Lymphatics: Non tender without lymphadenopathy.  Musculoskeletal: Full ROM, 5/5 strength, normal gait.  Skin: Warm, dry without rashes, lesions, ecchymosis.  Neuro: Cranial nerves intact. Normal muscle tone, no cerebellar symptoms. Sensation intact.  Psych: Awake and oriented X 3, normal affect, Insight and Judgment appropriate.    Vicie Mutters 2:44 PM

## 2014-03-11 NOTE — Patient Instructions (Signed)
Cough: Start on any of the following medications:  Loratadine take it at night Please start fluticasone nasal spray, 2 sprays each nostril twice a day  Work hard to stop throat clearing.  Plan a time when you will able to practice complete voice rest  Use sugar free candy to ease cough and throat irritation.  Do this for one month and if it does not work please get on omeprazole to 20mg  once daily for 2 weeks  If this does not work then we will send you to an ENT.     Bad carbs also include fruit juice, alcohol, and sweet tea. These are empty calories that do not signal to your brain that you are full.   Please remember the good carbs are still carbs which convert into sugar. So please measure them out no more than 1/2-1 cup of rice, oatmeal, pasta, and beans.  Veggies are however free foods! Pile them on.   I like lean protein at every meal such as chicken, [place], pork chops, cottage cheese, etc. Just do not fry these meats and please center your meal around vegetable, the meats should be a side dish.   No all fruit is created equal. Please see the list below, the fruit at the bottom is higher in sugars than the fruit at the top

## 2014-03-12 LAB — LIPID PANEL
CHOL/HDL RATIO: 2.4 ratio
CHOLESTEROL: 162 mg/dL (ref 0–200)
HDL: 67 mg/dL (ref >39)
LDL CALC: 70 mg/dL (ref 0–99)
TRIGLYCERIDES: 127 mg/dL (ref <150)
VLDL: 25 mg/dL (ref 0–40)

## 2014-03-12 LAB — BASIC METABOLIC PANEL WITH GFR
BUN: 18 mg/dL (ref 6–23)
CO2: 28 meq/L (ref 19–32)
Calcium: 9.5 mg/dL (ref 8.4–10.5)
Chloride: 104 mEq/L (ref 96–112)
Creat: 0.71 mg/dL (ref 0.50–1.10)
GFR, Est Non African American: >89 mL/min
GLUCOSE: 87 mg/dL (ref 70–99)
POTASSIUM: 3.7 meq/L (ref 3.5–5.3)
SODIUM: 140 meq/L (ref 135–145)

## 2014-03-12 LAB — TSH: TSH: 1.41 u[IU]/mL (ref 0.350–4.500)

## 2014-03-12 LAB — HEPATIC FUNCTION PANEL
ALT: 18 U/L (ref 0–35)
AST: 18 U/L (ref 0–37)
Albumin: 4.1 g/dL (ref 3.5–5.2)
Alkaline Phosphatase: 76 U/L (ref 39–117)
BILIRUBIN DIRECT: 0.2 mg/dL (ref 0.0–0.3)
BILIRUBIN INDIRECT: 0.6 mg/dL (ref 0.2–1.2)
BILIRUBIN TOTAL: 0.8 mg/dL (ref 0.2–1.2)
Total Protein: 7.5 g/dL (ref 6.0–8.3)

## 2014-03-12 LAB — INSULIN, FASTING: Insulin fasting, serum: 32 u[IU]/mL — ABNORMAL HIGH (ref 3–28)

## 2014-03-12 LAB — MAGNESIUM: Magnesium: 1.9 mg/dL (ref 1.5–2.5)

## 2014-04-12 ENCOUNTER — Other Ambulatory Visit: Payer: Self-pay | Admitting: Emergency Medicine

## 2014-04-12 MED ORDER — LOSARTAN POTASSIUM-HCTZ 100-12.5 MG PO TABS: ORAL_TABLET | ORAL | Status: DC

## 2014-07-10 ENCOUNTER — Ambulatory Visit (INDEPENDENT_AMBULATORY_CARE_PROVIDER_SITE_OTHER): Payer: Medicare Other | Admitting: Internal Medicine

## 2014-07-10 VITALS — BP 128/76 | HR 83 | Temp 98.2°F | Resp 24 | Ht 62.0 in | Wt 137.1 lb

## 2014-07-10 DIAGNOSIS — T63481A Toxic effect of venom of other arthropod, accidental (unintentional), initial encounter: Secondary | ICD-10-CM

## 2014-07-10 DIAGNOSIS — T6391XA Toxic effect of contact with unspecified venomous animal, accidental (unintentional), initial encounter: Secondary | ICD-10-CM | POA: Diagnosis not present

## 2014-07-10 MED ORDER — FLUOCINONIDE 0.05 % EX CREA: 1.0000 "application " | TOPICAL_CREAM | Freq: Three times a day (TID) | CUTANEOUS | Status: DC

## 2014-07-10 NOTE — Progress Notes (Signed)
   Subjective:  This chart was scribed for Tanya Lin, MD by Molli Posey, Medical scribe. This patient was seen in ROOM12  and the patient's care was started 3:58 PM.   Patient ID: [name], female    DOB: Mar 12, 1956, 57 y.o.   MRN: [id number]  HPI HPI Comments: [name] is a 58 y.o. female who presents to [facility] complaining of pain from a bee sting located on her right buttock that occurred today with associated swelling, itchiness and sensitivity to the area. Was dizzy for a few minutes but no sob or diff swallowing   Patient Active Problem List   Diagnosis Date Noted  . Prediabetes 03/11/2014  . Hyperlipidemia   . Hypertension   . Vitamin D deficiency   . TBI (traumatic brain injury)    Past Medical History  Diagnosis Date  . Hyperlipidemia   . Hypertension   . Prediabetes   . Vitamin D deficiency   . TBI (traumatic brain injury)     Memory issues   . Arthritis   . Seizures    Past Surgical History  Procedure Laterality Date  . Abdominal hysterectomy      BSO  . Knee arthroscopy Right    Prior to Admission medications   Medication Sig Start Date End Date Taking? Authorizing Provider  Cholecalciferol (VITAMIN D PO) Take 5,000 Int'l Units by mouth daily.   Yes Historical Provider, MD  Cyanocobalamin (B-12 PO) Take 2,500 mcg by mouth daily.   Yes Historical Provider, MD  losartan-hydrochlorothiazide [facility]) 100-12.5 MG per tablet take 1 tablet by mouth once daily 04/12/14  Yes Melissa R Smith, PA-C  MAGNESIUM PO Take 250 mg by mouth daily.   Yes Historical Provider, MD   Family History  Problem Relation Age of Onset  . Hypertension Mother   . Diabetes Mother   . Heart disease Father     No Known Allergies    Review of Systems  Skin: Positive for color change.  noncontr otherwise     Objective:   Physical Exam  Nursing note and vitals reviewed. Constitutional: She is oriented to person, place, and time. She appears well-developed and  well-nourished. No distress.  HENT:  Head: Normocephalic.  Mouth/Throat: Oropharynx is clear and moist.  Eyes: Conjunctivae are normal. Pupils are equal, round, and reactive to light.  Neck: Normal range of motion. Neck supple.  Cardiovascular: Normal rate and regular rhythm.   Pulmonary/Chest: Effort normal and breath sounds normal.  Neurological: She is alert and oriented to person, place, and time.  Skin:  On the right buttock is a 1.5cm area of induration with central punctum and minimal erythema and no vesiculation.   Psychiatric: She has a normal mood and affect. Her behavior is normal.     BP 128/76  Pulse 83  Temp(Src) 98.2 F (36.8 C) (Oral)  Resp 24  Ht 5\' 2"  (1.575 m)  Wt 137 lb 2 oz (62.199 kg)  BMI 25.07 kg/m2  SpO2 99%      Assessment & Plan:  I personally performed the services described in this documentation, which was scribed in my presence. The recorded information has been reviewed and is accurate.  Insect sting, accidental or unintentional, initial encounter  Meds ordered this encounter  Medications  . fluocinonide cream (LIDEX) 0.05 %    Sig: Apply 1 application topically 3 (three) times daily.    Dispense:  15 g    Refill:  0

## 2014-08-02 ENCOUNTER — Other Ambulatory Visit: Payer: Self-pay

## 2014-08-02 DIAGNOSIS — Z1239 Encounter for other screening for malignant neoplasm of breast: Secondary | ICD-10-CM

## 2014-09-02 ENCOUNTER — Ambulatory Visit
Admission: RE | Admit: 2014-09-02 | Discharge: 2014-09-02 | Disposition: A | Discharge status: Home / Self Care | Payer: Medicare Other | Source: Ambulatory Visit

## 2014-09-02 ENCOUNTER — Encounter (INDEPENDENT_AMBULATORY_CARE_PROVIDER_SITE_OTHER): Payer: Self-pay

## 2014-09-02 DIAGNOSIS — Z1231 Encounter for screening mammogram for malignant neoplasm of breast: Secondary | ICD-10-CM | POA: Diagnosis not present

## 2014-09-02 DIAGNOSIS — Z1239 Encounter for other screening for malignant neoplasm of breast: Secondary | ICD-10-CM

## 2014-10-06 ENCOUNTER — Encounter: Payer: Self-pay | Admitting: Physician Assistant

## 2014-10-06 ENCOUNTER — Ambulatory Visit (INDEPENDENT_AMBULATORY_CARE_PROVIDER_SITE_OTHER): Payer: Medicare Other | Admitting: Physician Assistant

## 2014-10-06 VITALS — BP 120/70 | HR 80 | Temp 98.1°F | Resp 16 | Ht 62.0 in | Wt 138.0 lb

## 2014-10-06 DIAGNOSIS — E785 Hyperlipidemia, unspecified: Secondary | ICD-10-CM

## 2014-10-06 DIAGNOSIS — I10 Essential (primary) hypertension: Secondary | ICD-10-CM

## 2014-10-06 DIAGNOSIS — E559 Vitamin D deficiency, unspecified: Secondary | ICD-10-CM

## 2014-10-06 DIAGNOSIS — R05 Cough: Secondary | ICD-10-CM | POA: Diagnosis not present

## 2014-10-06 DIAGNOSIS — R059 Cough, unspecified: Secondary | ICD-10-CM

## 2014-10-06 DIAGNOSIS — E538 Deficiency of other specified B group vitamins: Secondary | ICD-10-CM | POA: Diagnosis not present

## 2014-10-06 DIAGNOSIS — R6889 Other general symptoms and signs: Secondary | ICD-10-CM

## 2014-10-06 DIAGNOSIS — R7309 Other abnormal glucose: Secondary | ICD-10-CM | POA: Diagnosis not present

## 2014-10-06 DIAGNOSIS — Z0001 Encounter for general adult medical examination with abnormal findings: Secondary | ICD-10-CM | POA: Diagnosis not present

## 2014-10-06 DIAGNOSIS — Z9181 History of falling: Secondary | ICD-10-CM

## 2014-10-06 DIAGNOSIS — S069X0D Unspecified intracranial injury without loss of consciousness, subsequent encounter: Secondary | ICD-10-CM

## 2014-10-06 DIAGNOSIS — Z79899 Other long term (current) drug therapy: Secondary | ICD-10-CM | POA: Diagnosis not present

## 2014-10-06 DIAGNOSIS — R7303 Prediabetes: Secondary | ICD-10-CM

## 2014-10-06 LAB — BASIC METABOLIC PANEL WITH GFR
BUN: 13 mg/dL (ref 6–23)
CHLORIDE: 100 meq/L (ref 96–112)
CO2: 30 meq/L (ref 19–32)
CREATININE: 0.68 mg/dL (ref 0.50–1.10)
Calcium: 9.8 mg/dL (ref 8.4–10.5)
GFR, Est African American: >89 mL/min
GLUCOSE: 97 mg/dL (ref 70–99)
POTASSIUM: 3.7 meq/L (ref 3.5–5.3)
Sodium: 136 mEq/L (ref 135–145)

## 2014-10-06 LAB — HEPATIC FUNCTION PANEL
ALBUMIN: 4.4 g/dL (ref 3.5–5.2)
ALK PHOS: 80 U/L (ref 39–117)
ALT: 16 U/L (ref 0–35)
AST: 17 U/L (ref 0–37)
BILIRUBIN INDIRECT: 0.6 mg/dL (ref 0.2–1.2)
Bilirubin, Direct: 0.1 mg/dL (ref 0.0–0.3)
TOTAL PROTEIN: 7.7 g/dL (ref 6.0–8.3)
Total Bilirubin: 0.7 mg/dL (ref 0.2–1.2)

## 2014-10-06 LAB — CBC WITH DIFFERENTIAL/PLATELET
BASOS ABS: 0 10*3/uL (ref 0.0–0.1)
Basophils Relative: 0 % (ref 0–1)
EOS PCT: 2 % (ref 0–5)
Eosinophils Absolute: 0.1 10*3/uL (ref 0.0–0.7)
HEMATOCRIT: 41.4 % (ref 36.0–46.0)
Hemoglobin: 13.5 g/dL (ref 12.0–15.0)
LYMPHS ABS: 1.1 10*3/uL (ref 0.7–4.0)
LYMPHS PCT: 38 % (ref 12–46)
MCH: 30.7 pg (ref 26.0–34.0)
MCHC: 32.6 g/dL (ref 30.0–36.0)
MCV: 94.1 fL (ref 78.0–100.0)
MONO ABS: 0.4 10*3/uL (ref 0.1–1.0)
MONOS PCT: 14 % — AB (ref 3–12)
MPV: 12 fL (ref 9.4–12.4)
NEUTROS ABS: 1.3 10*3/uL — AB (ref 1.7–7.7)
Neutrophils Relative %: 46 % (ref 43–77)
PLATELETS: 175 10*3/uL (ref 150–400)
RBC: 4.4 MIL/uL (ref 3.87–5.11)
RDW: 13 % (ref 11.5–15.5)
WBC: 2.9 10*3/uL — AB (ref 4.0–10.5)

## 2014-10-06 LAB — LIPID PANEL
Cholesterol: 159 mg/dL (ref 0–200)
HDL: 59 mg/dL (ref >39)
LDL CALC: 79 mg/dL (ref 0–99)
Total CHOL/HDL Ratio: 2.7 Ratio
Triglycerides: 107 mg/dL (ref <150)
VLDL: 21 mg/dL (ref 0–40)

## 2014-10-06 LAB — HEMOGLOBIN A1C
HEMOGLOBIN A1C: 6.2 % — AB (ref <5.7)
Mean Plasma Glucose: 131 mg/dL — ABNORMAL HIGH (ref <117)

## 2014-10-06 LAB — MAGNESIUM: MAGNESIUM: 1.8 mg/dL (ref 1.5–2.5)

## 2014-10-06 MED ORDER — RANITIDINE HCL 300 MG PO TABS: 300.0000 mg | ORAL_TABLET | Freq: Every day | ORAL | Status: DC

## 2014-10-06 NOTE — Progress Notes (Signed)
MEDICARE ANNUAL WELLNESS VISIT AND CPE  Assessment:    Assessment and Plan: 1. Essential hypertension - continue medications, DASH diet, exercise and monitor at home. Call if greater than 130/80.  - CBC with Differential - BASIC METABOLIC PANEL WITH GFR - Hepatic function panel - TSH - Urinalysis, Routine w reflex microscopic - Microalbumin / creatinine urine ratio - EKG 12-Lead - [place], RETROPERITNL ABD,  LTD  2. Prediabetes Discussed general issues about diabetes pathophysiology and management., Educational material distributed., Suggested low cholesterol diet., Encouraged aerobic exercise., Discussed foot care., Reminded to get yearly retinal exam. - Hemoglobin A1c - Insulin, fasting - HM DIABETES FOOT EXAM  3. TBI (traumatic brain injury), without loss of consciousness, subsequent encounter At baseline  4. Hyperlipidemia - Lipid panel  5. Vitamin D deficiency - Vit D  25 hydroxy (rtn osteoporosis monitoring)  6. At low risk for fall No falls  7. Cough Lungs CTAB - ranitidine (ZANTAC) 300 MG tablet; Take 1 tablet (300 mg total) by mouth at bedtime.  Dispense: 30 tablet; Refill: 1  8. B12 deficiency - Vitamin B12  9. Medication management - Magnesium   Plan:   During the course of the visit the patient was educated and counseled about appropriate screening and preventive services including:    Pneumococcal vaccine   Influenza vaccine  Td vaccine  Screening electrocardiogram  Bone densitometry screening  Colorectal cancer screening  Diabetes screening  Glaucoma screening  Nutrition counseling   Advanced directives: requested  Screening recommendations, referrals: Vaccinations: Please see documentation below and orders this visit.  Nutrition assessed and recommended  Colonoscopy requested Recommended yearly ophthalmology/optometry visit for glaucoma screening and checkup Recommended yearly dental visit for hygiene and checkup Advanced  directives - requested  Conditions/risks identified: BMI: Discussed weight loss, diet, and increase physical activity.  Increase physical activity: AHA recommends 150 minutes of physical activity a week.  Medications reviewed Urinary Incontinence is not an issue: discussed non pharmacology and pharmacology options.  Fall risk: low- discussed PT, home fall assessment, medications.    Subjective:  [name] is a 58 y.o. female who presents for Medicare Annual Wellness Visit and complete physical.  Date of last medicare wellness visit is unknown.   Her blood pressure has been controlled at home, today their BP is BP: 120/70 mmHg She does workout, walks some and she is doing silver sneakers at the gym. She denies chest pain, shortness of breath, dizziness.  She is not on cholesterol medication and denies myalgias. Her cholesterol is at goal. The cholesterol last visit was:   Lab Results  Component Value Date   CHOL 162 03/11/2014   HDL 67 03/11/2014   LDLCALC 70 03/11/2014   TRIG 127 03/11/2014   CHOLHDL 2.4 03/11/2014   She has been working on diet and exercise for prediabetes, she is not on bASA, she is on ACE/ARB and denies paresthesia of the feet, polydipsia, polyuria and visual disturbances. Last A1C in the office was:  Lab Results  Component Value Date   HGBA1C 6.0* 03/11/2014   Patient is on Vitamin D supplement, she is on 5000 IU daily   Lab Results  Component Value Date   VD25OH 24 10/06/2013     She is also on the magnesium 250mg  daily.  BMI is Body mass index is 25.23 kg/(m^2)., she is working on diet and exercise, she is trying to eat better.  Wt Readings from Last 3 Encounters:  10/06/14 138 lb (62.596 kg)  07/10/14 137 lb 2  oz (62.199 kg)  03/11/14 138 lb (62.596 kg)  She has a history of TBI, is at baseline with some STM affected, and follows with Dr. Sabra Heck.  She complains of a nonproductive cough, worse at night and worse with lying on her back. No CP, SOB,  cough, wheezing with walking. She has some post nasal drip/congestion  Medication Review: Current Outpatient Prescriptions on File Prior to Visit  Medication Sig Dispense Refill  . Cholecalciferol (VITAMIN D PO) Take 5,000 Int'l Units by mouth daily.    . Cyanocobalamin (B-12 PO) Take 2,500 mcg by mouth daily.    . fluocinonide cream (LIDEX) 8.54 % Apply 1 application topically 3 (three) times daily. 15 g 0  . losartan-hydrochlorothiazide (HYZAAR) 100-12.5 MG per tablet take 1 tablet by mouth once daily 30 tablet 6  . MAGNESIUM PO Take 250 mg by mouth daily.     No current facility-administered medications on file prior to visit.    Current Problems (verified) Patient Active Problem List   Diagnosis Date Noted  . Prediabetes 03/11/2014  . Hyperlipidemia   . Hypertension   . Vitamin D deficiency   . TBI (traumatic brain injury)     Screening Tests Health Maintenance  Topic Date Due  . INFLUENZA VACCINE  05/15/2014  . PAP SMEAR  03/01/2015  . MAMMOGRAM  09/02/2016  . COLONOSCOPY  10/15/2016  . TETANUS/TDAP  09/24/2021    Immunization History  Administered Date(s) Administered  . Influenza Split 07/16/2013  . Pneumococcal Polysaccharide-23 04/14/2013  . Tdap 09/25/2011   Tetanus: 2012 Pneumovax: 2014 Flu vaccine: 2014- will get at [facility] Zostavax: Pap: 2013 due next year MGM:08/2014  DEXA: Colonoscopy: 2008 due 10 years EGD: Last Dental Exam: Dr. Rennie Plowman Last Eye Exam: [facility] Sept 2014 due   Patient Care Team: Unk Pinto, MD as PCP - General (Internal Medicine) Dr. Ubaldo Glassing Dr. Earlean Shawl Dr. Sabra Heck, neuro     Medication List       This list is accurate as of: 10/06/14  1:08 PM.  Always use your most recent med list.               B-12 PO  Take 2,500 mcg by mouth daily.     fluocinonide cream 0.05 %  Commonly known as:  LIDEX  Apply 1 application topically 3 (three) times daily.     losartan-hydrochlorothiazide 100-12.5 MG per  tablet  Commonly known as:  HYZAAR  take 1 tablet by mouth once daily     MAGNESIUM PO  Take 250 mg by mouth daily.     ranitidine 300 MG tablet  Commonly known as:  ZANTAC  Take 1 tablet (300 mg total) by mouth at bedtime.     VITAMIN D PO  Take 5,000 Int'l Units by mouth daily.        Past Surgical History  Procedure Laterality Date  . Abdominal hysterectomy      BSO  . Knee arthroscopy Right    Family History  Problem Relation Age of Onset  . Hypertension Mother   . Diabetes Mother   . Heart disease Father     History reviewed: allergies, current medications, past family history, past medical history, past social history, past surgical history and problem list  Review of Systems: Review of Systems  Constitutional: Negative.   HENT: Positive for congestion. Negative for ear discharge, ear pain, hearing loss, nosebleeds, sore throat and tinnitus.   Eyes: Negative.   Respiratory: Positive for cough. Negative for hemoptysis,  sputum production, shortness of breath, wheezing and stridor.   Cardiovascular: Negative.   Gastrointestinal: Positive for heartburn and abdominal pain (epigastric). Negative for nausea, vomiting, diarrhea, constipation, blood in stool and melena.  Genitourinary: Negative.   Musculoskeletal: Positive for joint pain (left knee pain). Negative for myalgias, back pain, falls and neck pain.  Skin: Negative.   Neurological: Negative.  Negative for headaches.  Endo/Heme/Allergies: Negative.   Psychiatric/Behavioral: Positive for memory loss. Negative for depression, suicidal ideas, hallucinations and substance abuse. The patient is not nervous/anxious and does not have insomnia.     Risk Factors: Osteoporosis/FallRisk: postmenopausal estrogen deficiency and dietary calcium and/or vitamin D deficiency In the past year have you fallen or had a near fall?:No History of fracture in the past year: no  Tobacco History  Substance Use Topics  . Smoking  status: Never Smoker   . Smokeless tobacco: Never Used  . Alcohol Use: No   She does not smoke.  Patient is not a former smoker. Are there smokers in your home (other than you)?  No  Alcohol Current alcohol use: none  Caffeine Current caffeine use: coffee 1 /day  Exercise Current exercise: no regular exercise  Nutrition/Diet Current diet: in general, a "healthy" diet    Cardiac risk factors: dyslipidemia, hypertension, obesity (BMI >= 30 kg/m2) and sedentary lifestyle.  Depression Screen (Note: if answer to either of the following is "Yes", a more complete depression screening is indicated)   Q1: Over the past two weeks, have you felt down, depressed or hopeless? No  Q2: Over the past two weeks, have you felt little interest or pleasure in doing things? No  Have you lost interest or pleasure in daily life? No  Do you often feel hopeless? No  Do you cry easily over simple problems? No  Activities of Daily Living In your present state of health, do you have any difficulty performing the following activities?:  Driving? No Managing money?  No Feeding yourself? No Getting from bed to chair? No Climbing a flight of stairs? No Preparing food and eating?: No Bathing or showering? No Getting dressed: No Getting to the toilet? No Using the toilet:No Moving around from place to place: No In the past year have you fallen or had a near fall?:No   Are you sexually active?  No  Do you have more than one partner?  No  Vision Difficulties: No  Hearing Difficulties: No Do you often ask people to speak up or repeat themselves? No Do you experience ringing or noises in your ears? No Do you have difficulty understanding soft or whispered voices? No  Cognition  Do you feel that you have a problem with memory?Yes  Do you often misplace items? Yes  Do you feel safe at home?  Yes  Advanced directives Does patient have a [place]? No Does patient have a  Living Will? No   Objective:     Blood pressure 120/70, pulse 80, temperature 98.1 F (36.7 C), resp. rate 16, height 5\' 2"  (1.575 m), weight 138 lb (62.596 kg). Body mass index is 25.23 kg/(m^2).  General appearance: alert, no distress, WD/WN, female Cognitive Testing  Alert? Yes  Normal Appearance?Yes  Oriented to person? Yes  Place? Yes   Time? Yes  Recall of three objects?  Yes  Can perform simple calculations? Yes  Displays appropriate judgment?Yes  Can read the correct time from a watch face?Yes  General Appearance: Well nourished, in no apparent distress.  Eyes:  PERRLA, EOMs, conjunctiva no swelling or erythema, normal fundi and vessels.  Sinuses: No Frontal/maxillary tenderness  ENT/Mouth: Ext aud canals clear, normal light reflex with TMs without erythema, bulging. Good dentition. No erythema, swelling, or exudate on post pharynx. Tonsils not swollen or erythematous. Hearing normal.  Neck: Supple, thyroid normal. No bruits  Respiratory: Respiratory effort normal, BS equal bilaterally without rales, rhonchi, wheezing or stridor.  Cardio: RRR without murmurs, rubs or gallops. Brisk peripheral pulses without edema.  Chest: symmetric, with normal excursions and percussion.  Breasts: defer Abdomen: Soft, + mild epigastric tenderness, no guarding, rebound, hernias, masses, or organomegaly. .  Lymphatics: Non tender without lymphadenopathy.  Genitourinary: defer Musculoskeletal: Full ROM all peripheral extremities,5/5 strength, and normal gait.  Skin: Warm, dry without rashes, lesions, ecchymosis. Neuro: Cranial nerves intact, reflexes equal bilaterally. Normal muscle tone, no cerebellar symptoms. Sensation intact.  Psych: Awake and oriented X 3, normal affect, Insight and Judgment appropriate.   EKG: WNL no changes. AORTA SCAN: WNL    Medicare Attestation I have personally reviewed: The patient's medical and social history Their use of alcohol, tobacco or illicit  drugs Their current medications and supplements The patient's functional ability including ADLs,fall risks, home safety risks, cognitive, and hearing and visual impairment Diet and physical activities Evidence for depression or mood disorders  The patient's weight, height, BMI, and visual acuity have been recorded in the chart.  I have made referrals, counseling, and provided education to the patient based on review of the above and I have provided the patient with a written personalized care plan for preventive services.     Vicie Mutters, PA-C   10/06/2014

## 2014-10-06 NOTE — Patient Instructions (Addendum)
Cough: Possible nocturnal GERD- please do the zantac once at night for 2 weeks, if it helps continue  Please pick one of the over the counter allergy medications below and take it once daily for allergies.  Claritin or loratadine cheapest but likely the weakest  Zyrtec or certizine at night because it can make you sleepy The strongest is allegra or fexafinadine  Cheapest at [employer], [employer], [employer]   Work hard to stop throat clearing.  Plan a time when you will able to practice complete voice rest  Use sugar free candy to ease cough and throat irritation.   Can cut Hyzaar in half.      Bad carbs also include fruit juice, alcohol, and sweet tea. These are empty calories that do not signal to your brain that you are full.   Please remember the good carbs are still carbs which convert into sugar. So please measure them out no more than 1/2-1 cup of rice, oatmeal, pasta, and beans  Veggies are however free foods! Pile them on.   Not all fruit is created equal. Please see the list below, the fruit at the bottom is higher in sugars than the fruit at the top. Please avoid all dried fruits.

## 2014-10-07 LAB — TSH: TSH: 1.715 u[IU]/mL (ref 0.350–4.500)

## 2014-10-07 LAB — URINALYSIS, ROUTINE W REFLEX MICROSCOPIC
BILIRUBIN URINE: NEGATIVE
GLUCOSE, UA: NEGATIVE mg/dL
HGB URINE DIPSTICK: NEGATIVE
Ketones, ur: NEGATIVE mg/dL
Leukocytes, UA: NEGATIVE
Nitrite: NEGATIVE
Protein, ur: NEGATIVE mg/dL
Specific Gravity, Urine: 1.011 (ref 1.005–1.030)
Urobilinogen, UA: 0.2 mg/dL (ref 0.0–1.0)
pH: 7 (ref 5.0–8.0)

## 2014-10-07 LAB — VITAMIN D 25 HYDROXY (VIT D DEFICIENCY, FRACTURES): Vit D, 25-Hydroxy: 62 ng/mL (ref 30–100)

## 2014-10-07 LAB — VITAMIN B12

## 2014-10-07 LAB — INSULIN, FASTING: INSULIN FASTING, SERUM: 11.1 u[IU]/mL (ref 2.0–19.6)

## 2014-10-07 LAB — MICROALBUMIN / CREATININE URINE RATIO
Creatinine, Urine: 40.3 mg/dL
MICROALB UR: 0.4 mg/dL (ref <2.0)
Microalb Creat Ratio: 9.9 mg/g (ref 0.0–30.0)

## 2014-11-10 ENCOUNTER — Other Ambulatory Visit: Payer: Self-pay | Admitting: *Deleted

## 2014-11-10 MED ORDER — LOSARTAN POTASSIUM-HCTZ 100-12.5 MG PO TABS: ORAL_TABLET | ORAL | Status: DC

## 2014-12-03 ENCOUNTER — Encounter (HOSPITAL_COMMUNITY): Payer: Self-pay | Admitting: *Deleted

## 2014-12-03 ENCOUNTER — Telehealth: Payer: Self-pay | Admitting: Internal Medicine

## 2014-12-03 ENCOUNTER — Emergency Department (HOSPITAL_COMMUNITY)
Admission: EM | Admit: 2014-12-03 | Discharge: 2014-12-03 | Disposition: A | Discharge status: Home / Self Care | Payer: Commercial Managed Care - HMO | Attending: Emergency Medicine | Admitting: Emergency Medicine

## 2014-12-03 DIAGNOSIS — E559 Vitamin D deficiency, unspecified: Secondary | ICD-10-CM | POA: Insufficient documentation

## 2014-12-03 DIAGNOSIS — Z7952 Long term (current) use of systemic steroids: Secondary | ICD-10-CM | POA: Diagnosis not present

## 2014-12-03 DIAGNOSIS — I10 Essential (primary) hypertension: Secondary | ICD-10-CM | POA: Diagnosis not present

## 2014-12-03 DIAGNOSIS — Y998 Other external cause status: Secondary | ICD-10-CM | POA: Diagnosis not present

## 2014-12-03 DIAGNOSIS — Z79899 Other long term (current) drug therapy: Secondary | ICD-10-CM | POA: Diagnosis not present

## 2014-12-03 DIAGNOSIS — Z8739 Personal history of other diseases of the musculoskeletal system and connective tissue: Secondary | ICD-10-CM | POA: Diagnosis not present

## 2014-12-03 DIAGNOSIS — S0990XA Unspecified injury of head, initial encounter: Secondary | ICD-10-CM | POA: Diagnosis not present

## 2014-12-03 DIAGNOSIS — Y9389 Activity, other specified: Secondary | ICD-10-CM | POA: Diagnosis not present

## 2014-12-03 DIAGNOSIS — Z87828 Personal history of other (healed) physical injury and trauma: Secondary | ICD-10-CM | POA: Insufficient documentation

## 2014-12-03 DIAGNOSIS — R51 Headache: Secondary | ICD-10-CM | POA: Diagnosis not present

## 2014-12-03 DIAGNOSIS — Y9289 Other specified places as the place of occurrence of the external cause: Secondary | ICD-10-CM | POA: Insufficient documentation

## 2014-12-03 DIAGNOSIS — W01198A Fall on same level from slipping, tripping and stumbling with subsequent striking against other object, initial encounter: Secondary | ICD-10-CM | POA: Diagnosis not present

## 2014-12-03 MED ORDER — IBUPROFEN 400 MG PO TABS
600.0000 mg | ORAL_TABLET | Freq: Once | ORAL | Status: AC
  Administered 2014-12-03: 600 mg via ORAL
  Filled 2014-12-03 (×2): qty 1

## 2014-12-03 NOTE — ED Notes (Signed)
Pt reports falling last Friday, tripped on rug and hit head on brick. No loc. Pt still has headache and reports dizziness, denies n/v.

## 2014-12-03 NOTE — Telephone Encounter (Signed)
fell and hit head recently, having headaches. Patient to go to ER to be evaluated.  Thank you, Katrina Judeth [facility] [phone] Fax [phone]

## 2014-12-03 NOTE — ED Provider Notes (Signed)
CSN: [id number]     Arrival date & time 12/03/14  1051 History   First MD Initiated Contact with Patient 12/03/14 1120     Chief Complaint  Patient presents with  . Fall     (Consider location/radiation/quality/duration/timing/severity/associated sxs/prior Treatment) HPI [name] is a 59 y.o. female with hx of htn, hyperlipidemia, prediabetes, past TBI, presents to ED with complaint of headache.  PT states she bumped her head on a brick wall 1 week ago. Since then, tenderness to the scalp, headache, "dizzy spells." States pain comes and goes. Pt denies LOC during the episode or a fall. Since then, no memory changes, no confusion, no bruising of bleeding, no vomiting. Denies difficulty speaking, walking. No weakness or numbness in extremities. No neck pain. Pt states she took ibuprofen 200mg  once. Has not taken any other medications for the headache. Pt reports two prior head injuries, states has chronic memory issues from that. Denies ever having "brain bleed." States has had CT scans in the past. Pt states the reason she came in today was because she suddenly became worried since her symptoms did not go away yet.   Past Medical History  Diagnosis Date  . Hyperlipidemia   . Hypertension   . Prediabetes   . Vitamin D deficiency   . TBI (traumatic brain injury)     Memory issues   . Arthritis   . Seizures    Past Surgical History  Procedure Laterality Date  . Abdominal hysterectomy      BSO  . Knee arthroscopy Right    Family History  Problem Relation Age of Onset  . Hypertension Mother   . Diabetes Mother   . Heart disease Father    History  Substance Use Topics  . Smoking status: Never Smoker   . Smokeless tobacco: Never Used  . Alcohol Use: No   OB History    No data available     Review of Systems  Constitutional: Negative for fever and chills.  Respiratory: Negative for cough, chest tightness and shortness of breath.   Cardiovascular: Negative for chest pain,  palpitations and leg swelling.  Gastrointestinal: Negative for nausea, vomiting, abdominal pain and diarrhea.  Musculoskeletal: Negative for myalgias, arthralgias, neck pain and neck stiffness.  Skin: Negative for rash.  Neurological: Positive for dizziness and headaches. Negative for seizures, syncope, speech difficulty, weakness and numbness.  All other systems reviewed and are negative.     Allergies  Review of patient's allergies indicates no known allergies.  Home Medications   Prior to Admission medications   Medication Sig Start Date End Date Taking? Authorizing Provider  Cholecalciferol (VITAMIN D PO) Take 5,000 Int'l Units by mouth daily.    Historical Provider, MD  Cyanocobalamin (B-12 PO) Take 2,500 mcg by mouth daily.    Historical Provider, MD  fluocinonide cream (LIDEX) 3.71 % Apply 1 application topically 3 (three) times daily. 07/10/14   Leandrew Koyanagi, MD  losartan-hydrochlorothiazide [facility]) 100-12.5 MG per tablet take 1 tablet by mouth once daily 11/10/14   Unk Pinto, MD  MAGNESIUM PO Take 250 mg by mouth daily.    Historical Provider, MD  ranitidine (ZANTAC) 300 MG tablet Take 1 tablet (300 mg total) by mouth at bedtime. 10/06/14 10/06/15  Vicie Mutters, PA-C   BP 158/80 mmHg  Pulse 82  Temp(Src) 98.4 F (36.9 C) (Oral)  Resp 18  SpO2 100% Physical Exam  Constitutional: She is oriented to person, place, and time. She appears well-developed and well-nourished. No  distress.  HENT:  Head: Normocephalic.  Right Ear: External ear normal.  Left Ear: External ear normal.  Nose: Nose normal.  Mouth/Throat: Oropharynx is clear and moist.  Tenderness to the right head just past the forehead. No hematoma or scalp deformity noted. Ear canals and TMs normal bilaterally with no hemotympanum. No raccoon eyes or battle's sign  Eyes: Conjunctivae and EOM are normal. Pupils are equal, round, and reactive to light.  Neck: Normal range of motion. Neck supple.   Cardiovascular: Normal rate, regular rhythm and normal heart sounds.   Pulmonary/Chest: Effort normal and breath sounds normal. No respiratory distress. She has no wheezes. She has no rales.  Abdominal: Soft. Bowel sounds are normal. She exhibits no distension. There is no tenderness. There is no rebound.  Musculoskeletal: She exhibits no edema.  Neurological: She is alert and oriented to person, place, and time. No cranial nerve deficit. Coordination normal.  5/5 and equal upper and lower extremity strength bilaterally. Equal grip strength bilaterally. Normal finger to nose and heel to shin. No pronator drift. Gait is normal  Skin: Skin is warm and dry.  Psychiatric: She has a normal mood and affect. Her behavior is normal.  Nursing note and vitals reviewed.   ED Course  Procedures (including critical care time) Labs Review Labs Reviewed - No data to display  Imaging Review No results found.   EKG Interpretation None      MDM   Final diagnoses:  Minor head injury, initial encounter   patient is here with headaches that come and go and some dizzy spells after hitting her head on the wall one week ago. Patient only tried to take ibuprofen one time. States it did help. She is not on any blood thinners. No vomiting, memory issues, confusion, amnesia. Patient did not have a loss of consciousness at time of the incident. She has normal neurological exam. She does not appear in any distress. She is smiling and laughing, does not appear to be in severe pain at this time. I discussed with her that at this time she does not need any imaging, and she is comfortable with that. She states she just wanted recommendation for what she could take for her headache. I advised her to take Tylenol or Motrin. She agrees to the plan, she is comfortable going home with her son who will be at home with her. She will follow-up with her primary care doctor. Based on [place] CT rule, normal exam, 1 week after  this minor injury, no further testing indicated on emergent basis. Most likely mild concussion.  Filed Vitals:   12/03/14 1056  BP: 158/80  Pulse: 82  Temp: 98.4 F (36.9 C)  Resp: [address] Aleigh Grunden, PA-C 12/03/14 1511  Virgel Manifold, MD 12/04/14 [phone]

## 2014-12-03 NOTE — Discharge Instructions (Signed)
Take ibuprofen 600mg  every 6 hrs for headache. You can also take Excedrine migraine for additional symptom relief. Follow up with your doctor if not improving.   Concussion A concussion is a brain injury. It is caused by:  A hit to the head.  A quick and sudden movement (jolt) of the head or neck. A concussion is usually not life threatening. Even so, it can cause serious problems. If you had a concussion before, you may have concussion-like problems after a hit to your head. HOME CARE General Instructions  Follow your doctor's directions carefully.  Take medicines only as told by your doctor.  Only take medicines your doctor says are safe.  Do not drink alcohol until your doctor says it is okay. Alcohol and some drugs can slow down healing. They can also put you at risk for further injury.  If you are having trouble remembering things, write them down.  Try to do one thing at a time if you get distracted easily. For example, do not watch TV while making dinner.  Talk to your family members or close friends when making important decisions.  Follow up with your doctor as told.  Watch your symptoms. Tell others to do the same. Serious problems can sometimes happen after a concussion. Older adults are more likely to have these problems.  Tell your teachers, school nurse, school counselor, coach, Product/process development scientist, or work Freight forwarder about your concussion. Tell them about what you can or cannot do. They should watch to see if:  It gets even harder for you to pay attention or concentrate.  It gets even harder for you to remember things or learn new things.  You need more time than normal to finish things.  You become annoyed (irritable) more than before.  You are not able to deal with stress as well.  You have more problems than before.  Rest. Make sure you:  Get plenty of sleep at night.  Go to sleep early.  Go to bed at the same time every day. Try to wake up at the same  time.  Rest during the day.  Take naps when you feel tired.  Limit activities where you have to think a lot or concentrate. These include:  Doing homework.  Doing work related to a job.  Watching TV.  Using the computer. Returning To Your Regular Activities Return to your normal activities slowly, not all at once. You must give your body and brain enough time to heal.   Do not play sports or do other athletic activities until your doctor says it is okay.  Ask your doctor when you can drive, ride a bicycle, or work other vehicles or machines. Never do these things if you feel dizzy.  Ask your doctor about when you can return to work or school. Preventing Another Concussion It is very important to avoid another brain injury, especially before you have healed. In rare cases, another injury can lead to permanent brain damage, brain swelling, or death. The risk of this is greatest during the first 7-10 days after your injury. Avoid injuries by:   Wearing a seat belt when riding in a car.  Not drinking too much alcohol.  Avoiding activities that could lead to a second concussion (such as contact sports).  Wearing a helmet when doing activities like:  Biking.  Skiing.  Skateboarding.  Skating.  Making your home safer by:  Removing things from the floor or stairways that could make you trip.  Using  grab bars in bathrooms and handrails by stairs.  Placing non-slip mats on floors and in bathtubs.  Improve lighting in dark areas. GET HELP IF:  It gets even harder for you to pay attention or concentrate.  It gets even harder for you to remember things or learn new things.  You need more time than normal to finish things.  You become annoyed (irritable) more than before.  You are not able to deal with stress as well.  You have more problems than before.  You have problems keeping your balance.  You are not able to react quickly when you should. Get help if you  have any of these problems for more than 2 weeks:   Lasting (chronic) headaches.  Dizziness or trouble balancing.  Feeling sick to your stomach (nausea).  Seeing (vision) problems.  Being affected by noises or light more than normal.  Feeling sad, low, down in the dumps, blue, gloomy, or empty (depressed).  Mood changes (mood swings).  Feeling of fear or nervousness about what may happen (anxiety).  Feeling annoyed.  Memory problems.  Problems concentrating or paying attention.  Sleep problems.  Feeling tired all the time. GET HELP RIGHT AWAY IF:   You have bad headaches or your headaches get worse.  You have weakness (even if it is in one hand, leg, or part of the face).  You have loss of feeling (numbness).  You feel off balance.  You keep throwing up (vomiting).  You feel tired.  One black center of your eye (pupil) is larger than the other.  You twitch or shake violently (convulse).  Your speech is not clear (slurred).  You are more confused, easily angered (agitated), or annoyed than before.  You have more trouble resting than before.  You are unable to recognize people or places.  You have neck pain.  It is difficult to wake you up.  You have unusual behavior changes.  You pass out (lose consciousness). MAKE SURE YOU:   Understand these instructions.  Will watch your condition.  Will get help right away if you are not doing well or get worse. Document Released: 09/19/2009 Document Revised: 02/15/2014 Document Reviewed: 04/23/2013 [facility] Patient Information 2015 [place], [place]. This information is not intended to replace advice given to you by your health care provider. Make sure you discuss any questions you have with your health care provider.

## 2014-12-03 NOTE — ED Notes (Signed)
Pt is stable upon d/c and verbalizes understanding rt d/c. Pt is escorted from ED via wheelchair by staff.

## 2015-06-09 ENCOUNTER — Encounter: Payer: Self-pay | Admitting: Internal Medicine

## 2015-06-30 ENCOUNTER — Ambulatory Visit (INDEPENDENT_AMBULATORY_CARE_PROVIDER_SITE_OTHER): Payer: Commercial Managed Care - HMO | Admitting: Family Medicine

## 2015-06-30 ENCOUNTER — Ambulatory Visit (INDEPENDENT_AMBULATORY_CARE_PROVIDER_SITE_OTHER): Payer: Commercial Managed Care - HMO

## 2015-06-30 VITALS — BP 128/80 | HR 99 | Temp 99.2°F | Resp 16 | Ht 62.0 in | Wt 136.2 lb

## 2015-06-30 DIAGNOSIS — T148XXA Other injury of unspecified body region, initial encounter: Secondary | ICD-10-CM

## 2015-06-30 DIAGNOSIS — M25562 Pain in left knee: Secondary | ICD-10-CM | POA: Diagnosis not present

## 2015-06-30 DIAGNOSIS — M25552 Pain in left hip: Secondary | ICD-10-CM

## 2015-06-30 DIAGNOSIS — T148 Other injury of unspecified body region: Secondary | ICD-10-CM | POA: Diagnosis not present

## 2015-06-30 MED ORDER — CYCLOBENZAPRINE HCL 5 MG PO TABS: 5.0000 mg | ORAL_TABLET | Freq: Every evening | ORAL | Status: DC | PRN

## 2015-06-30 MED ORDER — MELOXICAM 7.5 MG PO TABS: 7.5000 mg | ORAL_TABLET | Freq: Two times a day (BID) | ORAL | Status: DC | PRN

## 2015-06-30 NOTE — Patient Instructions (Signed)
Knee Sprain A knee sprain is a tear in one of the strong, fibrous tissues that connect the bones (ligaments) in your knee. The severity of the sprain depends on how much of the ligament is torn. The tear can be either partial or complete. CAUSES  Often, sprains are a result of a fall or injury. The force of the impact causes the fibers of your ligament to stretch too much. This excess tension causes the fibers of your ligament to tear. SIGNS AND SYMPTOMS  You may have some loss of motion in your knee. Other symptoms include:  Bruising.  Pain in the knee area.  Tenderness of the knee to the touch.  Swelling. DIAGNOSIS  To diagnose a knee sprain, your health care provider will physically examine your knee. Your health care provider may also suggest an X-ray exam of your knee to make sure no bones are broken. TREATMENT  If your ligament is only partially torn, treatment usually involves keeping the knee in a fixed position (immobilization) or bracing your knee for activities that require movement for several weeks. To do this, your health care provider will apply a bandage, cast, or splint to keep your knee from moving and to support your knee during movement until it heals. For a partially torn ligament, the healing process usually takes 4-6 weeks. If your ligament is completely torn, depending on which ligament it is, you may need surgery to reconnect the ligament to the bone or reconstruct it. After surgery, a cast or splint may be applied and will need to stay on your knee for 4-6 weeks while your ligament heals. HOME CARE INSTRUCTIONS  Keep your injured knee elevated to decrease swelling.  To ease pain and swelling, apply ice to the injured area:  Put ice in a plastic bag.  Place a towel between your skin and the bag.  Leave the ice on for 20 minutes, 2-3 times a day.  Only take medicine for pain as directed by your health care provider.  Do not leave your knee unprotected until  pain and stiffness go away (usually 4-6 weeks).  If you have a cast or splint, do not allow it to get wet. If you have been instructed not to remove it, cover it with a plastic bag when you shower or bathe. Do not swim.  Your health care provider may suggest exercises for you to do during your recovery to prevent or limit permanent weakness and stiffness. SEEK IMMEDIATE MEDICAL CARE IF:  Your cast or splint becomes damaged.  Your pain becomes worse.  You have significant pain, swelling, or numbness below the cast or splint. MAKE SURE YOU:  Understand these instructions.  Will watch your condition.  Will get help right away if you are not doing well or get worse. Document Released: 10/01/2005 Document Revised: 07/22/2013 Document Reviewed: 05/13/2013 [facility] Patient Information 2015 [place], [place]. This information is not intended to replace advice given to you by your health care provider. Make sure you discuss any questions you have with your health care provider.   Meloxicam tablets What is this medicine? MELOXICAM (mel OX i cam) is a non-steroidal anti-inflammatory drug (NSAID). It is used to reduce swelling and to treat pain. It may be used for osteoarthritis, rheumatoid arthritis, or juvenile rheumatoid arthritis. This medicine may be used for other purposes; ask your health care provider or pharmacist if you have questions. COMMON BRAND NAME(S): Mobic What should I tell my health care provider before I take this medicine?  They need to know if you have any of these conditions: -asthma -cigarette smoker -coronary artery bypass graft (CABG) surgery within the past 2 weeks -drink more than 3 alcohol-containing drinks a day -heart disease or circulation problems such as heart failure or leg edema (fluid retention) -hemophilia or bleeding problems -high blood pressure -kidney disease -liver disease -stomach bleeding or ulcers -an unusual or allergic reaction to meloxicam,  aspirin, other NSAIDs, other medicines, foods, dyes, or preservatives -pregnant or trying to get pregnant -breast-feeding How should I use this medicine? Take this medicine by mouth with a full glass of water. Follow the directions on the prescription label. Take this medicine in an upright or sitting position. If possible take bedtime doses at least 10 minutes before lying down. You can take it with or without food. If it upsets your stomach, take it with food. Take your medicine at regular intervals. Do not take it more often than directed. A special MedGuide will be given to you by the pharmacist with each prescription and refill. Be sure to read this information carefully each time. Talk to your pediatrician regarding the use of this medicine in children. Special care may be needed. Elderly patients over 55 years old may have a stronger reaction to this medicine and need smaller doses. Overdosage: If you think you have taken too much of this medicine contact a poison control center or emergency room at once. NOTE: This medicine is only for you. Do not share this medicine with others. What if I miss a dose? If you miss a dose, take it as soon as you can. If it is almost time for your next dose, take only that dose. Do not take double or extra doses. What may interact with this medicine? -alcohol -aspirin -cidofovir -diuretics -lithium -medicines for high blood pressure -methotrexate -other drugs for inflammation like ketorolac, ibuprofen, and prednisone -pemetrexed -warfarin This list may not describe all possible interactions. Give your health care provider a list of all the medicines, herbs, non-prescription drugs, or dietary supplements you use. Also tell them if you smoke, drink alcohol, or use illegal drugs. Some items may interact with your medicine. What should I watch for while using this medicine? Tell your doctor or healthcare professional if your pain does not get better. Talk  to your doctor before taking another medicine for pain. Do not treat yourself. This medicine does not prevent heart attack or stroke. If you take aspirin to prevent heart attack or stroke, talk with your doctor or health care professional. Do not take medicines such as ibuprofen and naproxen with this medicine. Side effects such as stomach upset, nausea, or ulcers may be more likely to occur. Many medicines available without a prescription should not be taken with this medicine. What side effects may I notice from receiving this medicine? Side effects that you should report to your doctor or health care professional as soon as possible: -black or bloody stools, blood in the urine or vomit -blurred vision -chest pain -difficulty breathing or wheezing -nausea or vomiting -skin rash, skin redness, blistering or peeling skin, hives, or itching -slurred speech or weakness on one side of the body -swelling of eyelids, throat, lips -unexplained weight gain or swelling -unusually weak or tired -yellowing of eyes or skin Side effects that usually do not require medical attention (report to your doctor or health care professional if they continue or are bothersome): -constipation or diarrhea -dizziness -gas or heartburn -stomach pain This list may not describe all  possible side effects. Call your doctor for medical advice about side effects. You may report side effects to FDA at 1-800-FDA-1088. Where should I keep my medicine? Keep out of the reach of children. Store at room temperature between 15 and 30 degrees C (59 and 86 degrees F). Protect from moisture. Keep container tightly closed. Throw away any unused medicine after the expiration date. NOTE: This sheet is a summary. It may not cover all possible information. If you have questions about this medicine, talk to your doctor, pharmacist, or health care provider.  2015, [employer]. (2010-01-23 21:15:42)  Cyclobenzaprine  tablets What is this medicine? CYCLOBENZAPRINE (sye kloe BEN za preen) is a muscle relaxer. It is used to treat muscle pain, spasms, and stiffness. This medicine may be used for other purposes; ask your health care provider or pharmacist if you have questions. COMMON BRAND NAME(S): Fexmid, Flexeril What should I tell my health care provider before I take this medicine? They need to know if you have any of these conditions: -heart disease, irregular heartbeat, or previous heart attack -liver disease -thyroid problem -an unusual or allergic reaction to cyclobenzaprine, tricyclic antidepressants, lactose, other medicines, foods, dyes, or preservatives -pregnant or trying to get pregnant -breast-feeding How should I use this medicine? Take this medicine by mouth with a glass of water. Follow the directions on the prescription label. If this medicine upsets your stomach, take it with food or milk. Take your medicine at regular intervals. Do not take it more often than directed. Talk to your pediatrician regarding the use of this medicine in children. Special care may be needed. Overdosage: If you think you have taken too much of this medicine contact a poison control center or emergency room at once. NOTE: This medicine is only for you. Do not share this medicine with others. What if I miss a dose? If you miss a dose, take it as soon as you can. If it is almost time for your next dose, take only that dose. Do not take double or extra doses. What may interact with this medicine? Do not take this medicine with any of the following medications: -certain medicines for fungal infections like fluconazole, itraconazole, ketoconazole, posaconazole, voriconazole -cisapride -dofetilide -dronedarone -droperidol -flecainide -grepafloxacin -halofantrine -levomethadyl -MAOIs like Carbex, Eldepryl, Marplan, Nardil, and  Parnate -nilotinib -pimozide -probucol -sertindole -thioridazine -ziprasidone This medicine may also interact with the following medications: -abarelix -alcohol -certain medicines for cancer -certain medicines for depression, anxiety, or psychotic disturbances -certain medicines for infection like alfuzosin, chloroquine, clarithromycin, levofloxacin, mefloquine, pentamidine, troleandomycin -certain medicines for an irregular heart beat -certain medicines used for sleep or numbness during surgery or procedure -contrast dyes -dolasetron -guanethidine -methadone -octreotide -ondansetron -other medicines that prolong the QT interval (cause an abnormal heart rhythm) -palonosetron -phenothiazines like chlorpromazine, mesoridazine, prochlorperazine, thioridazine -tramadol -vardenafil This list may not describe all possible interactions. Give your health care provider a list of all the medicines, herbs, non-prescription drugs, or dietary supplements you use. Also tell them if you smoke, drink alcohol, or use illegal drugs. Some items may interact with your medicine. What should I watch for while using this medicine? Check with your doctor or health care professional if your condition does not improve within 1 to 3 weeks. You may get drowsy or dizzy when you first start taking the medicine or change doses. Do not drive, use machinery, or do anything that may be dangerous until you know how the medicine affects you. Stand or sit up slowly. Your mouth  may get dry. Drinking water, chewing sugarless gum, or sucking on hard candy may help. What side effects may I notice from receiving this medicine? Side effects that you should report to your doctor or health care professional as soon as possible: -allergic reactions like skin rash, itching or hives, swelling of the face, lips, or tongue -chest pain -fast heartbeat -hallucinations -seizures -vomiting Side effects that usually do not require  medical attention (report to your doctor or health care professional if they continue or are bothersome): -headache This list may not describe all possible side effects. Call your doctor for medical advice about side effects. You may report side effects to FDA at 1-800-FDA-1088. Where should I keep my medicine? Keep out of the reach of children. Store at room temperature between 15 and 30 degrees C (59 and 86 degrees F). Keep container tightly closed. Throw away any unused medicine after the expiration date. NOTE: This sheet is a summary. It may not cover all possible information. If you have questions about this medicine, talk to your doctor, pharmacist, or health care provider.  2015, [employer]. (2013-04-28 12:48:19)

## 2015-06-30 NOTE — Progress Notes (Signed)
 Chief Complaint:  Chief Complaint  Patient presents with  . Leg Injury    left, x 1 day     HPI: [name] is a 59 y.o. female who reports to [facility] today complaining of  Left knee pain, she did it yesterday after tripping over rug that was in front of restaurant door. There were 2 rugs and her foot got caught underneath the top one and she did not fall but did try to prevent herself from falling . She just tripped. She has had aching intermittent knee pain since then, she has had knee arthroscopy on that side , she has had diffuse anterior and posterior knee pain and some swelling. Her orthopedist was [facility] for that knee. Denies n/w/t. She has pain with walking. She has not really tried anythign for this.    Past Medical History  Diagnosis Date  . Hyperlipidemia   . Hypertension   . Prediabetes   . Vitamin D deficiency   . TBI (traumatic brain injury)     Memory issues   . Arthritis   . Seizures    Past Surgical History  Procedure Laterality Date  . Abdominal hysterectomy      BSO  . Knee arthroscopy Right    Social History   Social History  . Marital Status: Divorced    Spouse Name: N/A  . Number of Children: N/A  . Years of Education: N/A   Social History Main Topics  . Smoking status: Never Smoker   . Smokeless tobacco: Never Used  . Alcohol Use: No  . Drug Use: No  . Sexual Activity: Not Asked   Other Topics Concern  . None   Social History Narrative   Family History  Problem Relation Age of Onset  . Hypertension Mother   . Diabetes Mother   . Heart disease Father    No Known Allergies Prior to Admission medications   Medication Sig Start Date End Date Taking? Authorizing Provider  losartan-hydrochlorothiazide (HYZAAR) 100-12.5 MG per tablet take 1 tablet by mouth once daily 11/10/14  Yes Unk Pinto, MD  MAGNESIUM PO Take 250 mg by mouth daily.   Yes Historical Provider, MD  ranitidine (ZANTAC) 300 MG tablet Take 1 tablet (300 mg  total) by mouth at bedtime. 10/06/14 10/06/15 Yes Vicie Mutters, PA-C  Cholecalciferol (VITAMIN D PO) Take 5,000 Int'l Units by mouth daily.    Historical Provider, MD  Cyanocobalamin (B-12 PO) Take 2,500 mcg by mouth daily.    Historical Provider, MD  fluocinonide cream (LIDEX) 4.03 % Apply 1 application topically 3 (three) times daily. Patient not taking: Reported on 06/30/2015 07/10/14   Leandrew Koyanagi, MD     ROS: The patient denies fevers, chills, night sweats, unintentional weight loss, chest pain, palpitations, wheezing, dyspnea on exertion, nausea, vomiting, abdominal pain, dysuria, hematuria, melena  All other systems have been reviewed and were otherwise negative with the exception of those mentioned in the HPI and as above.    PHYSICAL EXAM: Filed Vitals:   06/30/15 1953  BP: 128/80  Pulse: 99  Temp: 99.2 F (37.3 C)  Resp: 16   Body mass index is 24.91 kg/(m^2).   General: Alert, no acute distress HEENT:  Normocephalic, atraumatic, oropharynx patent. EOMI, PERRLA Cardiovascular:  Regular rate and rhythm, no rubs murmurs or gallops.  No Carotid bruits, radial pulse intact. No pedal edema.  Respiratory: Clear to auscultation bilaterally.  No wheezes, rales, or rhonchi.  No cyanosis,  no use of accessory musculature Abdominal: No organomegaly, abdomen is soft and non-tender, positive bowel sounds. No masses. Skin: No rashes. Neurologic: Facial musculature symmetric. Psychiatric: Patient acts appropriately throughout our interaction. Lymphatic: No cervical or submandibular lymphadenopathy Musculoskeletal: Gait antalgic. No deformity, Neg ballotment Diffuse tenderness Decrease AROM, full PROM +medial jt line tenderness, 5/5 strength, 2/2 DTRs ankle ,  Neg Lachman,  neg McMurray L spine normal ROM,  Straight leg negative Hip normal ROM     LABS: Results for orders placed or performed in visit on 10/06/14  CBC with Differential  Result Value Ref Range   WBC 2.9  (L) 4.0 - 10.5 K/uL   RBC 4.40 3.87 - 5.11 MIL/uL   Hemoglobin 13.5 12.0 - 15.0 g/dL   HCT 41.4 36.0 - 46.0 %   MCV 94.1 78.0 - 100.0 fL   MCH 30.7 26.0 - 34.0 pg   MCHC 32.6 30.0 - 36.0 g/dL   RDW 13.0 11.5 - 15.5 %   Platelets 175 150 - 400 K/uL   MPV 12.0 9.4 - 12.4 fL   Neutrophils Relative % 46 43 - 77 %   Neutro Abs 1.3 (L) 1.7 - 7.7 K/uL   Lymphocytes Relative 38 12 - 46 %   Lymphs Abs 1.1 0.7 - 4.0 K/uL   Monocytes Relative 14 (H) 3 - 12 %   Monocytes Absolute 0.4 0.1 - 1.0 K/uL   Eosinophils Relative 2 0 - 5 %   Eosinophils Absolute 0.1 0.0 - 0.7 K/uL   Basophils Relative 0 0 - 1 %   Basophils Absolute 0.0 0.0 - 0.1 K/uL   Smear Review Criteria for review not met   BASIC METABOLIC PANEL WITH GFR  Result Value Ref Range   Sodium 136 135 - 145 mEq/L   Potassium 3.7 3.5 - 5.3 mEq/L   Chloride 100 96 - 112 mEq/L   CO2 30 19 - 32 mEq/L   Glucose, Bld 97 70 - 99 mg/dL   BUN 13 6 - 23 mg/dL   Creat 0.68 0.50 - 1.10 mg/dL   Calcium 9.8 8.4 - 10.5 mg/dL   GFR, Est African American >89 mL/min   GFR, Est Non African American >89 mL/min  Hepatic function panel  Result Value Ref Range   Total Bilirubin 0.7 0.2 - 1.2 mg/dL   Bilirubin, Direct 0.1 0.0 - 0.3 mg/dL   Indirect Bilirubin 0.6 0.2 - 1.2 mg/dL   Alkaline Phosphatase 80 39 - 117 U/L   AST 17 0 - 37 U/L   ALT 16 0 - 35 U/L   Total Protein 7.7 6.0 - 8.3 g/dL   Albumin 4.4 3.5 - 5.2 g/dL  Lipid panel  Result Value Ref Range   Cholesterol 159 0 - 200 mg/dL   Triglycerides 107 <150 mg/dL   HDL 59 >39 mg/dL   Total CHOL/HDL Ratio 2.7 Ratio   VLDL 21 0 - 40 mg/dL   LDL Cholesterol 79 0 - 99 mg/dL  TSH  Result Value Ref Range   TSH 1.715 0.350 - 4.500 uIU/mL  Hemoglobin A1c  Result Value Ref Range   Hgb A1c MFr Bld 6.2 (H) <5.7 %   Mean Plasma Glucose 131 (H) <117 mg/dL  Insulin, fasting  Result Value Ref Range   Insulin fasting, serum 11.1 2.0 - 19.6 uIU/mL  Vit D  25 hydroxy (rtn osteoporosis monitoring)    Result Value Ref Range   Vit D, 25-Hydroxy 62 30 - 100 ng/mL  Urinalysis, Routine w  reflex microscopic  Result Value Ref Range   Color, Urine YELLOW YELLOW   APPearance CLEAR CLEAR   Specific Gravity, Urine 1.011 1.005 - 1.030   pH 7.0 5.0 - 8.0   Glucose, UA NEG NEG mg/dL   Bilirubin Urine NEG NEG   Ketones, ur NEG NEG mg/dL   Hgb urine dipstick NEG NEG   Protein, ur NEG NEG mg/dL   Urobilinogen, UA 0.2 0.0 - 1.0 mg/dL   Nitrite NEG NEG   ukocytes, UA NEG NEG  Microalbumin / creatinine urine ratio  Result Value Ref Range   Microalb, Ur 0.4 <2.0 mg/dL   Creatinine, Urine 40.3 mg/dL   Microalb Creat Ratio 9.9 0.0 - 30.0 mg/g  Vitamin B12  Result Value Ref Range   Vitamin B-12 >2000 (H) 211 - 911 pg/mL  Magnesium  Result Value Ref Range   Magnesium 1.8 1.5 - 2.5 mg/dL     EKG/XRAY:   Primary read interpreted by Dr. Marin Comment at [facility]. ? Old chronic DJD changes, she has had arthroscopic surgery for meniscus injury in the past No acute fractures or dislocations   ASSESSMENT/PLAN: Encounter Diagnoses  Name Primary?  . Hip pain, acute, left Yes  . Pain in joint, pelvic region and thigh, left   . ft knee pain   . Sprain and strain    Hinge knee brace Flexeril Mobic RICE FU prn   Gross sideeffects, risk and benefits, and alternatives of medications d/w patient. Patient is aware that all medications have potential sideeffects and we are unable to predict every sideeffect or drug-drug interaction that may occur.    DO  06/30/2015 8:59 PM

## 2015-08-24 ENCOUNTER — Other Ambulatory Visit: Payer: Self-pay

## 2015-08-24 DIAGNOSIS — Z1231 Encounter for screening mammogram for malignant neoplasm of breast: Secondary | ICD-10-CM

## 2015-09-13 ENCOUNTER — Ambulatory Visit
Admission: RE | Admit: 2015-09-13 | Discharge: 2015-09-13 | Disposition: A | Discharge status: Home / Self Care | Payer: Commercial Managed Care - HMO | Source: Ambulatory Visit

## 2015-09-13 DIAGNOSIS — Z1231 Encounter for screening mammogram for malignant neoplasm of breast: Secondary | ICD-10-CM

## 2015-10-17 ENCOUNTER — Telehealth: Payer: Self-pay

## 2015-10-17 DIAGNOSIS — M25562 Pain in left knee: Secondary | ICD-10-CM

## 2015-10-17 NOTE — Telephone Encounter (Signed)
Le - Patient says you were going to make her a referral for the back of her knee, but has never heard anything.  [phone]

## 2015-10-17 NOTE — Telephone Encounter (Signed)
Can you let her know that I thought she was going to call [facility] herself for this since she had seen them before. I can go ahead and put in the referral but if she has seen them so  it would be easier to call and make the appt herself sicne she is an established patient. But I will still go ahead and do the referral.

## 2015-10-18 ENCOUNTER — Ambulatory Visit (INDEPENDENT_AMBULATORY_CARE_PROVIDER_SITE_OTHER): Payer: Commercial Managed Care - HMO | Admitting: Physician Assistant

## 2015-10-18 ENCOUNTER — Other Ambulatory Visit (HOSPITAL_COMMUNITY)
Admission: RE | Admit: 2015-10-18 | Discharge: 2015-10-18 | Disposition: A | Discharge status: Home / Self Care | Payer: Commercial Managed Care - HMO | Source: Ambulatory Visit | Attending: Physician Assistant | Admitting: Physician Assistant

## 2015-10-18 ENCOUNTER — Encounter: Payer: Self-pay | Admitting: Physician Assistant

## 2015-10-18 VITALS — BP 130/80 | HR 81 | Temp 98.1°F | Resp 16 | Ht 63.0 in | Wt 136.0 lb

## 2015-10-18 DIAGNOSIS — R7309 Other abnormal glucose: Secondary | ICD-10-CM | POA: Diagnosis not present

## 2015-10-18 DIAGNOSIS — Z79899 Other long term (current) drug therapy: Secondary | ICD-10-CM

## 2015-10-18 DIAGNOSIS — Z01411 Encounter for gynecological examination (general) (routine) with abnormal findings: Secondary | ICD-10-CM | POA: Diagnosis not present

## 2015-10-18 DIAGNOSIS — Z0000 Encounter for general adult medical examination without abnormal findings: Secondary | ICD-10-CM

## 2015-10-18 DIAGNOSIS — E559 Vitamin D deficiency, unspecified: Secondary | ICD-10-CM

## 2015-10-18 DIAGNOSIS — R7303 Prediabetes: Secondary | ICD-10-CM | POA: Diagnosis not present

## 2015-10-18 DIAGNOSIS — R6889 Other general symptoms and signs: Secondary | ICD-10-CM | POA: Diagnosis not present

## 2015-10-18 DIAGNOSIS — Z124 Encounter for screening for malignant neoplasm of cervix: Secondary | ICD-10-CM

## 2015-10-18 DIAGNOSIS — E785 Hyperlipidemia, unspecified: Secondary | ICD-10-CM | POA: Diagnosis not present

## 2015-10-18 DIAGNOSIS — Z0001 Encounter for general adult medical examination with abnormal findings: Secondary | ICD-10-CM | POA: Diagnosis not present

## 2015-10-18 DIAGNOSIS — I10 Essential (primary) hypertension: Secondary | ICD-10-CM | POA: Diagnosis not present

## 2015-10-18 DIAGNOSIS — S069X0D Unspecified intracranial injury without loss of consciousness, subsequent encounter: Secondary | ICD-10-CM

## 2015-10-18 DIAGNOSIS — E538 Deficiency of other specified B group vitamins: Secondary | ICD-10-CM

## 2015-10-18 DIAGNOSIS — Z1151 Encounter for screening for human papillomavirus (HPV): Secondary | ICD-10-CM | POA: Diagnosis not present

## 2015-10-18 LAB — CBC WITH DIFFERENTIAL/PLATELET
BASOS ABS: 0 10*3/uL (ref 0.0–0.1)
Basophils Relative: 1 % (ref 0–1)
EOS ABS: 0.1 10*3/uL (ref 0.0–0.7)
Eosinophils Relative: 3 % (ref 0–5)
HCT: 39.8 % (ref 36.0–46.0)
Hemoglobin: 13.1 g/dL (ref 12.0–15.0)
LYMPHS ABS: 1.2 10*3/uL (ref 0.7–4.0)
LYMPHS PCT: 44 % (ref 12–46)
MCH: 30.5 pg (ref 26.0–34.0)
MCHC: 32.9 g/dL (ref 30.0–36.0)
MCV: 92.6 fL (ref 78.0–100.0)
MPV: 12.4 fL (ref 8.6–12.4)
Monocytes Absolute: 0.3 10*3/uL (ref 0.1–1.0)
Monocytes Relative: 12 % (ref 3–12)
NEUTROS PCT: 40 % — AB (ref 43–77)
Neutro Abs: 1.1 10*3/uL — ABNORMAL LOW (ref 1.7–7.7)
PLATELETS: 169 10*3/uL (ref 150–400)
RBC: 4.3 MIL/uL (ref 3.87–5.11)
RDW: 13 % (ref 11.5–15.5)
WBC: 2.8 10*3/uL — ABNORMAL LOW (ref 4.0–10.5)

## 2015-10-18 MED ORDER — LOSARTAN POTASSIUM-HCTZ 100-12.5 MG PO TABS: ORAL_TABLET | ORAL | Status: DC

## 2015-10-18 NOTE — Progress Notes (Signed)
Complete Physical  Assessment and Plan: 1. Essential hypertension - continue medications, DASH diet, exercise and monitor at home. Call if greater than 130/80.  Refilled medication - CBC with Differential/Platelet - BASIC METABOLIC PANEL WITH GFR - Hepatic function panel - TSH - Urinalysis, Routine w reflex microscopic (not at [facility]) - Microalbumin / creatinine urine ratio - EKG 12-Lead  2. TBI (traumatic brain injury), without loss of consciousness, subsequent encounter Continue follow up Dr. Sabra Heck  3. Prediabetes Discussed general issues about diabetes pathophysiology and management., Educational material distributed., Suggested low cholesterol diet., Encouraged aerobic exercise., Discussed foot care., Reminded to get yearly retinal exam. - Hemoglobin A1c - Insulin, fasting  4. Hyperlipidemia -continue medications, check lipids, decrease fatty foods, increase activity.  - Lipid panel  5. Vitamin D deficiency - VITAMIN D 25 Hydroxy (Vit-D Deficiency, Fractures)  6. Screening for cervical cancer - Cytology - PAP  7. B12 deficiency - Vitamin B12  8. Encounter for general adult medical examination with abnormal findings - CBC with Differential/Platelet - BASIC METABOLIC PANEL WITH GFR - Hepatic function panel - TSH - Lipid panel - Hemoglobin A1c - Insulin, fasting - Magnesium - VITAMIN D 25 Hydroxy (Vit-D Deficiency, Fractures) - Urinalysis, Routine w reflex microscopic (not at [facility]) - Microalbumin / creatinine urine ratio - Vitamin B12 - EKG 12-Lead - Cytology - PAP  9. Medication management - Magnesium  Discussed med's effects and SE's. Screening labs and tests as requested with regular follow-up as recommended. Over 40 minutes of exam, counseling, chart review, and complex, high level critical decision making was performed this visit.   HPI  60 y.o. female  presents for a complete physical.  Her blood pressure has been controlled at home on cozaar 1/2 pill  for BP, today their BP is BP: 130/80 mmHg She does workout. She denies chest pain, shortness of breath, dizziness.  She is not on cholesterol medication and denies myalgias. Her cholesterol is at goal. The cholesterol last visit was:   Lab Results  Component Value Date   CHOL 159 10/06/2014   HDL 59 10/06/2014   LDLCALC 79 10/06/2014   TRIG 107 10/06/2014   CHOLHDL 2.7 10/06/2014   She has been working on diet and exercise for prediabetes,  and denies paresthesia of the feet, polydipsia, polyuria and visual disturbances. Last A1C in the office was:  Lab Results  Component Value Date   HGBA1C 6.2* 10/06/2014   Patient is on Vitamin D supplement.   Lab Results  Component Value Date   VD25OH 62 10/06/2014     Has history of TBI, is at baseline with some STM, follows Dr. Sabra Heck. Had seizures with pregnancy years ago, but has not any since then.  Is recovering from cold, still has cough but is doing well.  Has separated from her husband.   Current Medications:  Current Outpatient Prescriptions on File Prior to Visit  Medication Sig Dispense Refill  . Cholecalciferol (VITAMIN D PO) Take 5,000 Int'l Units by mouth daily.    . Cyanocobalamin (B-12 PO) Take 2,500 mcg by mouth daily.    . cyclobenzaprine (FLEXERIL) 5 MG tablet Take 1 tablet (5 mg total) by mouth at bedtime as needed. 30 tablet 0  . fluocinonide cream (LIDEX) [id number] % Apply 1 application topically 3 (three) times daily. 15 g 0  . losartan-hydrochlorothiazide (HYZAAR) 100-12.5 MG per tablet take 1 tablet by mouth once daily 30 tablet 6  . MAGNESIUM PO Take 250 mg by mouth daily.    Marland Kitchen  meloxicam (MOBIC) 7.5 MG tablet Take 1 tablet (7.5 mg total) by mouth 2 (two) times daily as needed for pain. No other NSAIDs, take with food. 30 tablet 0  . ranitidine (ZANTAC) 300 MG tablet Take 1 tablet (300 mg total) by mouth at bedtime. 30 tablet 1   No current facility-administered medications on file prior to visit.   Health Maintenance:    Immunization History  Administered Date(s) Administered  . Influenza Split 07/16/2013  . Pneumococcal Polysaccharide-23 04/14/2013  . Tdap 09/25/2011   Tetanus: 2012 Pneumovax: 2014 Flu vaccine: 2016 Zostavax: N/A Pap: 2013, sees Dr. Ubaldo Glassing but has not been, will get today MGM:08/2015 DEXA: Colonoscopy: 2008 due 10 years EGD: Last Dental Exam: Dr. Rennie Plowman Last Eye Exam: [facility], wears glasses, has appointment 10/30/2016  Patient Care Team: Unk Pinto, MD as PCP - General (Internal Medicine) Rolm Bookbinder, MD as Consulting Physician (Dermatology) Richmond Campbell, MD as Consulting Physician (Gastroenterology)  Allergies: No Known Allergies Medical History:  Past Medical History  Diagnosis Date  . Hyperlipidemia   . Hypertension   . Prediabetes   . Vitamin D deficiency   . TBI (traumatic brain injury) [facility])     Memory issues   . Arthritis   . Seizures [facility])    Surgical History:  Past Surgical History  Procedure Laterality Date  . Abdominal hysterectomy      BSO  . Knee arthroscopy Right    Family History:  Family History  Problem Relation Age of Onset  . Hypertension Mother   . Diabetes Mother   . Heart disease Father    Social History:  Social History  Substance Use Topics  . Smoking status: Never Smoker   . Smokeless tobacco: Never Used  . Alcohol Use: No    Review of Systems: Review of Systems  Constitutional: Negative.   HENT: Negative.   Eyes: Negative.   Respiratory: Negative.   Cardiovascular: Negative.   Gastrointestinal: Negative.   Genitourinary: Negative.   Musculoskeletal: Negative.   Skin: Negative.   Neurological: Negative.   Endo/Heme/Allergies: Negative.   Psychiatric/Behavioral: Negative.     Physical Exam: Estimated body mass index is 24.1 kg/(m^2) as calculated from the following:   Height as of this encounter: 5\' 3"  (1.6 m).   Weight as of this encounter: 136 lb (61.689 kg). BP 130/80 mmHg  Pulse 81   Temp(Src) 98.1 F (36.7 C) (Temporal)  Resp 16  Ht 5\' 3"  (1.6 m)  Wt 136 lb (61.689 kg)  BMI 24.10 kg/m2  SpO2 98% General Appearance: Well nourished, in no apparent distress.  Eyes: PERRLA, EOMs, conjunctiva no swelling or erythema, normal fundi and vessels.  Sinuses: No Frontal/maxillary tenderness  ENT/Mouth: Ext aud canals clear, normal light reflex with TMs without erythema, bulging. Good dentition. No erythema, swelling, or exudate on post pharynx. Tonsils not swollen or erythematous. Hearing normal.  Neck: Supple, thyroid normal. No bruits  Respiratory: Respiratory effort normal, BS equal bilaterally without rales, rhonchi, wheezing or stridor.  Cardio: RRR without murmurs, rubs or gallops. Brisk peripheral pulses without edema.  Chest: symmetric, with normal excursions and percussion.  Breasts: Symmetric, without lumps, nipple discharge, retractions.  Abdomen: Soft, nontender, no guarding, rebound, hernias, masses, or organomegaly.  Lymphatics: Non tender without lymphadenopathy.  Genitourinary: normal external genitalia, vulva, vagina, cervix, uterus and adnexa, PAP: Pap smear done today. Musculoskeletal: Full ROM all peripheral extremities,5/5 strength, and normal gait.  Skin: Warm, dry without rashes, lesions, ecchymosis. Neuro: Cranial nerves intact, reflexes equal bilaterally.  Normal muscle tone, no cerebellar symptoms. Sensation intact.  Psych: Awake and oriented X 3, normal affect, Insight and Judgment appropriate.   EKG: WNL no ST changes. AORTA SCAN: defer  Vicie Mutters 9:55 AM [facility]

## 2015-10-19 LAB — LIPID PANEL
CHOLESTEROL: 135 mg/dL (ref 125–200)
HDL: 64 mg/dL (ref >=46)
LDL Cholesterol: 61 mg/dL (ref <130)
TRIGLYCERIDES: 51 mg/dL (ref <150)
Total CHOL/HDL Ratio: 2.1 Ratio (ref <=5.0)
VLDL: 10 mg/dL (ref <30)

## 2015-10-19 LAB — HEPATIC FUNCTION PANEL
ALBUMIN: 4.1 g/dL (ref 3.6–5.1)
ALT: 17 U/L (ref 6–29)
AST: 17 U/L (ref 10–35)
Alkaline Phosphatase: 74 U/L (ref 33–130)
Bilirubin, Direct: 0.2 mg/dL (ref <=0.2)
Indirect Bilirubin: 0.6 mg/dL (ref 0.2–1.2)
TOTAL PROTEIN: 7.3 g/dL (ref 6.1–8.1)
Total Bilirubin: 0.8 mg/dL (ref 0.2–1.2)

## 2015-10-19 LAB — HEMOGLOBIN A1C
Hgb A1c MFr Bld: 6.5 % — ABNORMAL HIGH (ref <5.7)
Mean Plasma Glucose: 140 mg/dL — ABNORMAL HIGH (ref <117)

## 2015-10-19 LAB — URINALYSIS, ROUTINE W REFLEX MICROSCOPIC
Bilirubin Urine: NEGATIVE
Glucose, UA: NEGATIVE
HGB URINE DIPSTICK: NEGATIVE
KETONES UR: NEGATIVE
Leukocytes, UA: NEGATIVE
NITRITE: NEGATIVE
Protein, ur: NEGATIVE
Specific Gravity, Urine: 1.024 (ref 1.001–1.035)
pH: 5.5 (ref 5.0–8.0)

## 2015-10-19 LAB — TSH: TSH: 2.018 u[IU]/mL (ref 0.350–4.500)

## 2015-10-19 LAB — BASIC METABOLIC PANEL WITH GFR
BUN: 15 mg/dL (ref 7–25)
CALCIUM: 9.2 mg/dL (ref 8.6–10.4)
CO2: 31 mmol/L (ref 20–31)
Chloride: 102 mmol/L (ref 98–110)
Creat: 0.77 mg/dL (ref 0.50–1.05)
GFR, EST NON AFRICAN AMERICAN: 85 mL/min (ref >=60)
GLUCOSE: 89 mg/dL (ref 65–99)
Potassium: 3.6 mmol/L (ref 3.5–5.3)
Sodium: 139 mmol/L (ref 135–146)

## 2015-10-19 LAB — VITAMIN D 25 HYDROXY (VIT D DEFICIENCY, FRACTURES): Vit D, 25-Hydroxy: 59 ng/mL (ref 30–100)

## 2015-10-19 LAB — MAGNESIUM: MAGNESIUM: 1.8 mg/dL (ref 1.5–2.5)

## 2015-10-19 LAB — MICROALBUMIN / CREATININE URINE RATIO
CREATININE, URINE: 258 mg/dL (ref 20–320)
MICROALB UR: 1.1 mg/dL
Microalb Creat Ratio: 4 mcg/mg creat (ref <30)

## 2015-10-19 LAB — VITAMIN B12: Vitamin B-12: 1397 pg/mL — ABNORMAL HIGH (ref 211–911)

## 2015-10-19 LAB — INSULIN, FASTING: INSULIN FASTING, SERUM: 6.3 u[IU]/mL (ref 2.0–19.6)

## 2015-10-19 NOTE — Telephone Encounter (Signed)
Spoke with pt, advised message. Pt understood. 

## 2015-10-20 LAB — CYTOLOGY - PAP

## 2015-10-31 DIAGNOSIS — H5213 Myopia, bilateral: Secondary | ICD-10-CM | POA: Diagnosis not present

## 2015-10-31 DIAGNOSIS — H5210 Myopia, unspecified eye: Secondary | ICD-10-CM | POA: Diagnosis not present

## 2016-04-23 ENCOUNTER — Ambulatory Visit: Payer: Self-pay | Admitting: Physician Assistant

## 2016-04-30 ENCOUNTER — Encounter: Payer: Self-pay | Admitting: Physician Assistant

## 2016-04-30 ENCOUNTER — Ambulatory Visit (INDEPENDENT_AMBULATORY_CARE_PROVIDER_SITE_OTHER): Payer: Commercial Managed Care - HMO | Admitting: Physician Assistant

## 2016-04-30 VITALS — BP 140/90 | HR 98 | Temp 97.5°F | Resp 16 | Ht 63.0 in | Wt 137.2 lb

## 2016-04-30 DIAGNOSIS — R6889 Other general symptoms and signs: Secondary | ICD-10-CM

## 2016-04-30 DIAGNOSIS — R7303 Prediabetes: Secondary | ICD-10-CM | POA: Diagnosis not present

## 2016-04-30 DIAGNOSIS — Z0001 Encounter for general adult medical examination with abnormal findings: Secondary | ICD-10-CM

## 2016-04-30 DIAGNOSIS — S069X0D Unspecified intracranial injury without loss of consciousness, subsequent encounter: Secondary | ICD-10-CM

## 2016-04-30 DIAGNOSIS — Z79899 Other long term (current) drug therapy: Secondary | ICD-10-CM

## 2016-04-30 DIAGNOSIS — Z0000 Encounter for general adult medical examination without abnormal findings: Secondary | ICD-10-CM

## 2016-04-30 DIAGNOSIS — E559 Vitamin D deficiency, unspecified: Secondary | ICD-10-CM | POA: Diagnosis not present

## 2016-04-30 DIAGNOSIS — I10 Essential (primary) hypertension: Secondary | ICD-10-CM | POA: Diagnosis not present

## 2016-04-30 DIAGNOSIS — E785 Hyperlipidemia, unspecified: Secondary | ICD-10-CM | POA: Diagnosis not present

## 2016-04-30 LAB — CBC WITH DIFFERENTIAL/PLATELET
BASOS ABS: 35 {cells}/uL (ref 0–200)
Basophils Relative: 1 %
EOS ABS: 35 {cells}/uL (ref 15–500)
EOS PCT: 1 %
HCT: 41.3 % (ref 35.0–45.0)
HEMOGLOBIN: 14 g/dL (ref 11.7–15.5)
Lymphocytes Relative: 41 %
Lymphs Abs: 1435 cells/uL (ref 850–3900)
MCH: 31.5 pg (ref 27.0–33.0)
MCHC: 33.9 g/dL (ref 32.0–36.0)
MCV: 92.8 fL (ref 80.0–100.0)
MONOS PCT: 12 %
MPV: 12.1 fL (ref 7.5–12.5)
Monocytes Absolute: 420 cells/uL (ref 200–950)
NEUTROS PCT: 45 %
Neutro Abs: 1575 cells/uL (ref 1500–7800)
PLATELETS: 192 10*3/uL (ref 140–400)
RBC: 4.45 MIL/uL (ref 3.80–5.10)
RDW: 12.6 % (ref 11.0–15.0)
WBC: 3.5 10*3/uL — ABNORMAL LOW (ref 3.8–10.8)

## 2016-04-30 LAB — HEPATIC FUNCTION PANEL
ALBUMIN: 4.5 g/dL (ref 3.6–5.1)
ALT: 17 U/L (ref 6–29)
AST: 16 U/L (ref 10–35)
Alkaline Phosphatase: 85 U/L (ref 33–130)
BILIRUBIN DIRECT: 0.2 mg/dL (ref <=0.2)
Indirect Bilirubin: 0.5 mg/dL (ref 0.2–1.2)
TOTAL PROTEIN: 8 g/dL (ref 6.1–8.1)
Total Bilirubin: 0.7 mg/dL (ref 0.2–1.2)

## 2016-04-30 LAB — BASIC METABOLIC PANEL WITH GFR
BUN: 19 mg/dL (ref 7–25)
CALCIUM: 10 mg/dL (ref 8.6–10.4)
CHLORIDE: 100 mmol/L (ref 98–110)
CO2: 27 mmol/L (ref 20–31)
CREATININE: 0.82 mg/dL (ref 0.50–1.05)
GFR, Est African American: >89 mL/min (ref >=60)
GFR, Est Non African American: 79 mL/min (ref >=60)
Glucose, Bld: 94 mg/dL (ref 65–99)
Potassium: 3.6 mmol/L (ref 3.5–5.3)
SODIUM: 137 mmol/L (ref 135–146)

## 2016-04-30 LAB — LIPID PANEL
CHOL/HDL RATIO: 2.1 ratio (ref <=5.0)
CHOLESTEROL: 153 mg/dL (ref 125–200)
HDL: 74 mg/dL (ref >=46)
LDL Cholesterol: 64 mg/dL (ref <130)
Triglycerides: 77 mg/dL (ref <150)
VLDL: 15 mg/dL (ref <30)

## 2016-04-30 LAB — MAGNESIUM: MAGNESIUM: 1.9 mg/dL (ref 1.5–2.5)

## 2016-04-30 LAB — HEMOGLOBIN A1C
HEMOGLOBIN A1C: 6.3 % — AB (ref <5.7)
MEAN PLASMA GLUCOSE: 134 mg/dL

## 2016-04-30 LAB — TSH: TSH: 1.53 m[IU]/L

## 2016-04-30 MED ORDER — ALPRAZOLAM 0.5 MG PO TABS: 0.5000 mg | ORAL_TABLET | Freq: Every evening | ORAL | Status: DC | PRN

## 2016-04-30 MED ORDER — LOSARTAN POTASSIUM-HCTZ 100-12.5 MG PO TABS: ORAL_TABLET | ORAL | Status: DC

## 2016-04-30 NOTE — Progress Notes (Signed)
MEDICARE ANNUAL WELLNESS VISIT AND FOLLOW UP  Assessment:    Assessment and Plan: Essential hypertension - continue medications, DASH diet, exercise and monitor at home. Call if greater than 130/80.  - CBC with Differential - BASIC METABOLIC PANEL WITH GFR - Hepatic function panel - TSH - Urinalysis, Routine w reflex microscopic - Microalbumin / creatinine urine ratio - EKG 12-Lead - [place], RETROPERITNL ABD,  LTD  Prediabetes - close to DM range, if again at 6.5 will change diagnosis Discussed general issues about diabetes pathophysiology and management., Educational material distributed., Suggested low cholesterol diet., Encouraged aerobic exercise., Discussed foot care., Reminded to get yearly retinal exam. - Hemoglobin A1c - Insulin, fasting - HM DIABETES FOOT EXAM  TBI (traumatic brain injury), without loss of consciousness, subsequent encounter At baseline   Hyperlipidemia - Lipid panel  Vitamin D deficiency - Vit D  25 hydroxy (rtn osteoporosis monitoring)  GERD - ranitidine (ZANTAC) 300 MG tablet; Take 1 tablet (300 mg total) by mouth at bedtime.  Dispense: 30 tablet; Refill: 1  Medication management - Magnesium  Anxiety/insomnia Xanax 0.5mg  PRN for sleep during divorce #20 NR   Plan:   During the course of the visit the patient was educated and counseled about appropriate screening and preventive services including:    Pneumococcal vaccine   Influenza vaccine  Td vaccine  Screening electrocardiogram  Bone densitometry screening  Colorectal cancer screening  Diabetes screening  Glaucoma screening  Nutrition counseling   Advanced directives: requested   Subjective:  [name] is a 60 y.o. female who presents for Medicare Annual Wellness Visit and follow up.    Her blood pressure has been controlled at home, she is on 1/2 pill daily, states at home it is controlled, today BP: 140/90 mmHg  She does not workout, due to stress has not gone  but wants to start again at the gym. She denies chest pain, shortness of breath, dizziness.  She is not on cholesterol medication and denies myalgias. Her cholesterol is at goal. The cholesterol last visit was:   Lab Results  Component Value Date   CHOL 135 10/18/2015   HDL 64 10/18/2015   LDLCALC 61 10/18/2015   TRIG 51 10/18/2015   CHOLHDL 2.1 10/18/2015   She has been working on diet and exercise for prediabetes, she is not on bASA, she is on ACE/ARB and denies paresthesia of the feet, polydipsia, polyuria and visual disturbances. Last A1C in the office was:  Lab Results  Component Value Date   HGBA1C 6.5* 10/18/2015   Patient is on Vitamin D supplement, she is on 5000 IU daily   Lab Results  Component Value Date   VD25OH 49 10/18/2015   She is also on the magnesium 250mg  daily.  BMI is Body mass index is 24.31 kg/(m^2)., she is working on diet and exercise. Wt Readings from Last 3 Encounters:  04/30/16 137 lb 3.2 oz (62.234 kg)  10/18/15 136 lb (61.689 kg)  06/30/15 136 lb 3.2 oz (61.78 kg)   She has a history of TBI, is at baseline with some STM affected, and follows with Dr. Sabra Heck.  She is separating from her husband, going through divorce, has had increased stress with this, she is not sleeping well at night, but denies any SI/HI, trouble eating/crying, etc.   Medication Review: Current Outpatient Prescriptions on File Prior to Visit  Medication Sig Dispense Refill  . Cholecalciferol (VITAMIN D PO) Take 5,000 Int'l Units by mouth daily.    Marland Kitchen  Cyanocobalamin (B-12 PO) Take 2,500 mcg by mouth daily.    . fluocinonide cream (LIDEX) [id number] % Apply 1 application topically 3 (three) times daily. 15 g 0  . MAGNESIUM PO Take 250 mg by mouth daily.    . meloxicam (MOBIC) 7.5 MG tablet Take 1 tablet (7.5 mg total) by mouth 2 (two) times daily as needed for pain. No other NSAIDs, take with food. 30 tablet 0  . ranitidine (ZANTAC) 300 MG tablet Take 1 tablet (300 mg total) by mouth at  bedtime. 30 tablet 1   No current facility-administered medications on file prior to visit.    Current Problems (verified) Patient Active Problem List   Diagnosis Date Noted  . Encounter for Medicare annual wellness exam 04/30/2016  . Prediabetes 03/11/2014  . Hyperlipidemia   . Hypertension   . Vitamin D deficiency   . TBI (traumatic brain injury) ([place])     Screening Tests Immunization History  Administered Date(s) Administered  . Influenza Split 07/16/2013  . Pneumococcal Polysaccharide-23 04/14/2013  . Tdap 09/25/2011   Tetanus: 2012 Pneumovax: 2014 Prevnar: age 39 Flu vaccine: 2014- will get at [facility] Zostavax: declines Pap: 10/2015 MGM:08/2015 DEXA: none Colonoscopy: 2008 due 10 years  Last Dental Exam: Dr. Rennie Plowman May 2017 Last Eye Exam: [facility] Feb 2017  Patient Care Team: Unk Pinto, MD as PCP - General (Internal Medicine) Dr. Ubaldo Glassing Dr. Earlean Shawl Dr. Sabra Heck, neuro  Allergies No Known Allergies SURGICAL HISTORY She  has past surgical history that includes Abdominal hysterectomy and Knee arthroscopy (Right). FAMILY HISTORY Her family history includes Diabetes in her mother; Heart disease in her father; Hypertension in her mother. SOCIAL HISTORY She  reports that she has never smoked. She has never used smokeless tobacco. She reports that she does not drink alcohol or use illicit drugs.  MEDICARE WELLNESS OBJECTIVES: Physical activity: Current Exercise Habits: The patient does not participate in regular exercise at present Cardiac risk factors: Cardiac Risk Factors include: diabetes mellitus;dyslipidemia;hypertension;sedentary lifestyle Depression/mood screen:   Depression screen [facility] 2/9 04/30/2016  Decreased Interest 0  Down, Depressed, Hopeless 0  PHQ - 2 Score 0    ADLs:  In your present state of health, do you have any difficulty performing the following activities: 04/30/2016  Hearing? N  Vision? N  Difficulty concentrating or  making decisions? N  Walking or climbing stairs? N  Dressing or bathing? N  Doing errands, shopping? N     Cognitive Testing  Alert? Yes  Normal Appearance?Yes  Oriented to person? Yes  Place? Yes   Time? Yes  Recall of three objects?  Yes  Can perform simple calculations? Yes  Displays appropriate judgment?Yes  Can read the correct time from a watch face?Yes  EOL planning: Does patient have an advance directive?: No Would patient like information on creating an advanced directive?: Yes - Educational materials given  Review of Systems: Review of Systems  Constitutional: Negative.   HENT: Negative for congestion, ear discharge, ear pain, hearing loss, nosebleeds, sore throat and tinnitus.   Eyes: Negative.   Respiratory: Negative for cough, hemoptysis, sputum production, shortness of breath, wheezing and stridor.   Cardiovascular: Negative.   Gastrointestinal: Positive for heartburn and abdominal pain (epigastric). Negative for nausea, vomiting, diarrhea, constipation, blood in stool and melena.  Genitourinary: Negative.   Musculoskeletal: Positive for joint pain (left knee pain). Negative for myalgias, back pain, falls and neck pain.  Skin: Negative.   Neurological: Negative.  Negative for headaches.  Endo/Heme/Allergies: Negative.  Psychiatric/Behavioral: Positive for memory loss. Negative for depression, suicidal ideas, hallucinations and substance abuse. The patient is nervous/anxious and has insomnia.     Objective:     Blood pressure 140/90, pulse 98, temperature 97.5 F (36.4 C), temperature source Temporal, resp. rate 16, height 5\' 3"  (1.6 m), weight 137 lb 3.2 oz (62.234 kg), SpO2 96 %.  General appearance: alert, no distress, WD/WN, female General Appearance: Well nourished, in no apparent distress.  Eyes: PERRLA, EOMs, conjunctiva no swelling or erythema, normal fundi and vessels.  Sinuses: No Frontal/maxillary tenderness  ENT/Mouth: Ext aud canals clear, normal  light reflex with TMs without erythema, bulging. Good dentition. No erythema, swelling, or exudate on post pharynx. Tonsils not swollen or erythematous. Hearing normal.  Neck: Supple, thyroid normal. No bruits  Respiratory: Respiratory effort normal, BS equal bilaterally without rales, rhonchi, wheezing or stridor.  Cardio: RRR without murmurs, rubs or gallops. Brisk peripheral pulses without edema.  Chest: symmetric, with normal excursions and percussion.  Abdomen: Soft, + mild epigastric tenderness, no guarding, rebound, hernias, masses, or organomegaly. .  Lymphatics: Non tender without lymphadenopathy.  Musculoskeletal: Full ROM all peripheral extremities,5/5 strength, and normal gait.  Skin: Warm, dry without rashes, lesions, ecchymosis. Neuro: Cranial nerves intact, reflexes equal bilaterally. Normal muscle tone, no cerebellar symptoms. Sensation intact.  Psych: Awake and oriented X 3, normal affect, Insight and Judgment appropriate.    Medicare Attestation I have personally reviewed: The patient's medical and social history Their use of alcohol, tobacco or illicit drugs Their current medications and supplements The patient's functional ability including ADLs,fall risks, home safety risks, cognitive, and hearing and visual impairment Diet and physical activities Evidence for depression or mood disorders  The patient's weight, height, BMI, and visual acuity have been recorded in the chart.  I have made referrals, counseling, and provided education to the patient based on review of the above and I have provided the patient with a written personalized care plan for preventive services.     Vicie Mutters, PA-C   04/30/2016

## 2016-04-30 NOTE — Patient Instructions (Addendum)
Get on zantac/ratinitine 300mg  at bed time Can take 1/2-1 of the xanax at night as needed for sleep/anxiety Start back at the gym    Your A1C is a measure of your sugar over the past 3 months and is not affected by what you have eaten over the past few days. Diabetes increases your chances of stroke and heart attack over 300 % and is the leading cause of blindness and kidney failure in the [place]. Please make sure you decrease bad carbs like white bread, white rice, potatoes, corn, soft drinks, pasta, cereals, refined sugars, sweet tea, dried fruits, and fruit juice. Good carbs are okay to eat in moderation like sweet potatoes, brown rice, whole grain pasta/bread, most fruit (except dried fruit) and you can eat as many veggies as you want.   Greater than 6.5 is considered diabetic. Between 6.4 and 5.7 is prediabetic If your A1C is less than 5.7 you are NOT diabetic.  Targets for Glucose Readings: Time of Check Target for patients WITHOUT Diabetes Target for DIABETICS  Before Meals Less than 100  less than 150  Two hours after meals Less than 200  Less than 250      Bad carbs also include fruit juice, alcohol, and sweet tea. These are empty calories that do not signal to your brain that you are full.   Please remember the good carbs are still carbs which convert into sugar. So please measure them out no more than 1/2-1 cup of rice, oatmeal, pasta, and beans  Veggies are however free foods! Pile them on.   Not all fruit is created equal. Please see the list below, the fruit at the bottom is higher in sugars than the fruit at the top. Please avoid all dried fruits.     . Diabetes is a very complicated disease...lets simplify it.  An easy way to look at it to understand the complications is if you think of the extra sugar floating in your blood stream as glass shards floating through your blood stream.    Diabetes affects your small vessels first: 1) The glass shards (sugar)  scraps down the tiny blood vessels in your eyes and lead to diabetic retinopathy, the leading cause of blindness in the [place]. Diabetes is the leading cause of newly diagnosed adult (74 to 59 years of age) blindness in the [place].  2) The glass shards scratches down the tiny vessels of your legs leading to nerve damage called neuropathy and can lead to amputations of your feet. More than 60% of all non-traumatic amputations of lower limbs occur in people with diabetes.  3) Over time the small vessels in your brain are shredded and closed off, individually this does not cause any problems but over a long period of time many of the small vessels being blocked can lead to Vascular Dementia.   4) Your kidney's are a filter system and have a "net" that keeps certain things in the body and lets bad things out. Sugar shreds this net and leads to kidney damage and eventually failure. Decreasing the sugar that is destroying the net and certain blood pressure medications can help stop or decrease progression of kidney disease. Diabetes was the primary cause of kidney failure in 44 percent of all new cases in 2011.  5) Diabetes also destroys the small vessels in your penis that lead to erectile dysfunction. Eventually the vessels are so damaged that you may not be responsive to cialis or viagra.   Diabetes  and your large vessels: Your larger vessels consist of your coronary arteries in your heart and the carotid vessels to your brain. Diabetes or even increased sugars put you at 300% increased risk of heart attack and stroke and this is why.. The sugar scrapes down your large blood vessels and your body sees this as an internal injury and tries to repair itself. Just like you get a scab on your skin, your platelets will stick to the blood vessel wall trying to heal it. This is why we have diabetics on low dose aspirin daily, this prevents the platelets from sticking and can prevent plaque formation. In  addition, your body takes cholesterol and tries to shove it into the open wound. This is why we want your LDL, or bad cholesterol, below 70.   The combination of platelets and cholesterol over 5-10 years forms plaque that can break off and cause a heart attack or stroke.   PLEASE REMEMBER:  Diabetes is preventable! Up to 66 percent of complications and morbidities among individuals with type 2 diabetes can be prevented, delayed, or effectively treated and minimized with regular visits to a health professional, appropriate monitoring and medication, and a healthy diet and lifestyle.

## 2016-05-01 LAB — VITAMIN D 25 HYDROXY (VIT D DEFICIENCY, FRACTURES): VIT D 25 HYDROXY: 50 ng/mL (ref 30–100)

## 2016-08-03 ENCOUNTER — Ambulatory Visit: Payer: Self-pay | Admitting: Internal Medicine

## 2016-08-08 ENCOUNTER — Other Ambulatory Visit: Payer: Self-pay | Admitting: Internal Medicine

## 2016-08-10 ENCOUNTER — Other Ambulatory Visit: Payer: Self-pay | Admitting: Physician Assistant

## 2016-08-31 ENCOUNTER — Other Ambulatory Visit: Payer: Self-pay | Admitting: Primary Care

## 2016-08-31 DIAGNOSIS — Z13220 Encounter for screening for lipoid disorders: Secondary | ICD-10-CM | POA: Diagnosis not present

## 2016-08-31 DIAGNOSIS — Z1231 Encounter for screening mammogram for malignant neoplasm of breast: Secondary | ICD-10-CM

## 2016-08-31 DIAGNOSIS — Z7689 Persons encountering health services in other specified circumstances: Secondary | ICD-10-CM | POA: Diagnosis not present

## 2016-08-31 DIAGNOSIS — I10 Essential (primary) hypertension: Secondary | ICD-10-CM | POA: Diagnosis not present

## 2016-08-31 DIAGNOSIS — Z833 Family history of diabetes mellitus: Secondary | ICD-10-CM | POA: Diagnosis not present

## 2016-09-14 DIAGNOSIS — Z833 Family history of diabetes mellitus: Secondary | ICD-10-CM | POA: Diagnosis not present

## 2016-09-14 DIAGNOSIS — Z0000 Encounter for general adult medical examination without abnormal findings: Secondary | ICD-10-CM | POA: Diagnosis not present

## 2016-09-14 DIAGNOSIS — I10 Essential (primary) hypertension: Secondary | ICD-10-CM | POA: Diagnosis not present

## 2016-09-28 ENCOUNTER — Ambulatory Visit
Admission: RE | Admit: 2016-09-28 | Discharge: 2016-09-28 | Disposition: A | Discharge status: Home / Self Care | Payer: Commercial Managed Care - HMO | Source: Ambulatory Visit | Attending: Primary Care | Admitting: Primary Care

## 2016-09-28 DIAGNOSIS — Z1231 Encounter for screening mammogram for malignant neoplasm of breast: Secondary | ICD-10-CM | POA: Diagnosis not present

## 2016-10-01 DIAGNOSIS — R7309 Other abnormal glucose: Secondary | ICD-10-CM | POA: Diagnosis not present

## 2016-10-01 DIAGNOSIS — Z719 Counseling, unspecified: Secondary | ICD-10-CM | POA: Diagnosis not present

## 2016-10-01 DIAGNOSIS — Z713 Dietary counseling and surveillance: Secondary | ICD-10-CM | POA: Diagnosis not present

## 2016-10-01 DIAGNOSIS — Z09 Encounter for follow-up examination after completed treatment for conditions other than malignant neoplasm: Secondary | ICD-10-CM | POA: Diagnosis not present

## 2016-10-17 ENCOUNTER — Encounter: Payer: Self-pay | Admitting: Physician Assistant

## 2017-08-21 ENCOUNTER — Other Ambulatory Visit: Payer: Self-pay | Admitting: Primary Care

## 2017-08-21 DIAGNOSIS — Z1231 Encounter for screening mammogram for malignant neoplasm of breast: Secondary | ICD-10-CM

## 2017-09-30 ENCOUNTER — Ambulatory Visit: Payer: Commercial Managed Care - HMO

## 2017-09-30 ENCOUNTER — Ambulatory Visit
Admission: RE | Admit: 2017-09-30 | Discharge: 2017-09-30 | Disposition: A | Discharge status: Home / Self Care | Payer: Medicare HMO | Source: Ambulatory Visit | Attending: Primary Care | Admitting: Primary Care

## 2017-09-30 DIAGNOSIS — Z1231 Encounter for screening mammogram for malignant neoplasm of breast: Secondary | ICD-10-CM

## 2018-04-17 ENCOUNTER — Ambulatory Visit (HOSPITAL_COMMUNITY)
Admission: EM | Admit: 2018-04-17 | Discharge: 2018-04-17 | Disposition: A | Discharge status: Home / Self Care | Payer: Medicare HMO | Attending: Family Medicine | Admitting: Family Medicine

## 2018-04-17 ENCOUNTER — Encounter (HOSPITAL_COMMUNITY): Payer: Self-pay | Admitting: *Deleted

## 2018-04-17 DIAGNOSIS — S50862A Insect bite (nonvenomous) of left forearm, initial encounter: Secondary | ICD-10-CM

## 2018-04-17 DIAGNOSIS — W57XXXA Bitten or stung by nonvenomous insect and other nonvenomous arthropods, initial encounter: Principal | ICD-10-CM

## 2018-04-17 DIAGNOSIS — T63441A Toxic effect of venom of bees, accidental (unintentional), initial encounter: Secondary | ICD-10-CM

## 2018-04-17 MED ORDER — PREDNISONE 50 MG PO TABS: 50.0000 mg | ORAL_TABLET | Freq: Every day | ORAL | 0 refills | Status: AC

## 2018-04-17 MED ORDER — RANITIDINE HCL 150 MG PO CAPS: 150.0000 mg | ORAL_CAPSULE | Freq: Every day | ORAL | 0 refills | Status: DC

## 2018-04-17 NOTE — ED Triage Notes (Signed)
Reports getting stung by wasp 2 days ago in left distal forearm.  C/O increasing redness, warmth, swelling to site radiating into left hand.

## 2018-04-17 NOTE — ED Provider Notes (Signed)
[facility]    CSN: [id number] Arrival date & time: 04/17/18  1237     History   Chief Complaint No chief complaint on file.   HPI [name] is a 62 y.o. female.   The history is provided by the patient. No language interpreter was used.  Animal Bite  Contact animal:  Insect Location:  Shoulder/arm Shoulder/arm injury location:  L forearm Time since incident:  2 days Pain details:    Quality:  Itching and stinging (Looks red)   Severity:  Mild   Timing:  Constant   Progression:  Worsening Incident location:  Outside Provoked: provoked (Clipping the edge of her rose bush)   Relieved by:  Cold compresses (Alcohol) Worsened by:  Nothing Ineffective treatments:  Cold compresses Associated symptoms: swelling   Associated symptoms: no fever, no numbness and no rash   She had bee sting years back with similar reaction without respiratory symptoms.  Past Medical History:  Diagnosis Date  . Arthritis   . Hyperlipidemia   . Hypertension   . Prediabetes   . Seizures ([place])   . TBI (traumatic brain injury) [facility])    Memory issues   . Vitamin D deficiency     Patient Active Problem List   Diagnosis Date Noted  . Encounter for Medicare annual wellness exam 04/30/2016  . Prediabetes 03/11/2014  . Hyperlipidemia   . Hypertension   . Vitamin D deficiency   . TBI (traumatic brain injury) [facility])     Past Surgical History:  Procedure Laterality Date  . ABDOMINAL HYSTERECTOMY     BSO  . KNEE ARTHROSCOPY Right     OB History   None      Home Medications    Prior to Admission medications   Medication Sig Start Date End Date Taking? Authorizing Provider  ALPRAZolam Duanne Moron) 0.5 MG tablet Take 1 tablet (0.5 mg total) by mouth at bedtime as needed for anxiety or sleep. 04/30/16   Vicie Mutters, PA-C  Cholecalciferol (VITAMIN D PO) Take 5,000 Int'l Units by mouth daily.    [provider]  Cyanocobalamin (B-12 PO) Take 2,500 mcg by mouth daily.     [provider]  fluocinonide cream (LIDEX) 8.29 % Apply 1 application topically 3 (three) times daily. 07/10/14   Leandrew Koyanagi, MD  losartan-hydrochlorothiazide [facility]) 100-12.5 MG tablet take 1/2 tablet by mouth once daily 04/30/16   Vicie Mutters, PA-C  MAGNESIUM PO Take 250 mg by mouth daily.    [provider]  meloxicam (MOBIC) 7.5 MG tablet Take 1 tablet (7.5 mg total) by mouth 2 (two) times daily as needed for pain. No other NSAIDs, take with food. 06/30/15   Le, Thao P, DO  ranitidine (ZANTAC) 300 MG tablet Take 1 tablet (300 mg total) by mouth at bedtime. 10/06/14 10/06/15  Vicie Mutters, PA-C    Family History Family History  Problem Relation Age of Onset  . Hypertension Mother   . Diabetes Mother   . Heart disease Father     Social History Social History   Tobacco Use  . Smoking status: Never Smoker  . Smokeless tobacco: Never Used  Substance Use Topics  . Alcohol use: No  . Drug use: No     Allergies   Patient has no known allergies.   Review of Systems Review of Systems  Constitutional: Negative for fever.  Respiratory: Negative.   Cardiovascular: Negative.   Gastrointestinal: Negative.   Skin: Negative for rash.  Insect bite on skin  Neurological: Negative for numbness.  All other systems reviewed and are negative.    Physical Exam Triage Vital Signs ED Triage Vitals  Enc Vitals Group     BP      Pulse      Resp      Temp      Temp src      SpO2      Weight      Height      Head Circumference      Peak Flow      Pain Score      Pain Loc      Pain Edu?      Excl. in [place]?    No data found.  Updated Vital Signs There were no vitals taken for this visit.  Visual Acuity Right Eye Distance:   Left Eye Distance:   Bilateral Distance:    Right Eye Near:   Left Eye Near:    Bilateral Near:     Physical Exam  Constitutional: She appears well-developed. No distress.  HENT:  Head: Normocephalic.    Mouth/Throat: Uvula is midline, oropharynx is clear and moist and mucous membranes are normal. No tonsillar exudate.  Eyes: EOM are normal.  Neck: Neck supple.  Cardiovascular: Normal rate, regular rhythm and normal heart sounds.  No murmur heard. Pulmonary/Chest: Effort normal. No stridor. No respiratory distress. She has no wheezes. She has no rales.  Abdominal: Soft. Bowel sounds are normal. She exhibits no distension and no mass. There is no tenderness.  Musculoskeletal:  Left forearm mildly swollen, firm and pink, no inflammation, no tenderness, no loss of sensation. Radial and ulna pulsation intact.  Nursing note and vitals reviewed.    UC Treatments / Results  Labs (all labs ordered are listed, but only abnormal results are displayed) Labs Reviewed - No data to display  EKG None  Radiology No results found.  Procedures Procedures (including critical care time)  Medications Ordered in UC Medications - No data to display  Initial Impression / Assessment and Plan / UC Course  I have reviewed the triage vital signs and the nursing notes.  Pertinent labs & imaging results that were available during my care of the patient were reviewed by me and considered in my medical decision making (see chart for details).  Clinical Course as of Apr 17 1344  Thu Apr 17, 2018  1344 Bee sting. No concomitant soil contaminant, hence Tdap not warranted. F/U with PCP for vaccination update. Oral steroid and antihistamine prescribed. Red flag signs discussed, she will need ED visit for SOB or throat closure. F/U as needed.   [KE]    Clinical Course User Index [KE] Kinnie Feil, MD    Insect bite of left forearm, initial encounter  Bee sting reaction, accidental or unintentional, initial encounter   Final Clinical Impressions(s) / UC Diagnoses   Final diagnoses:  None   Discharge Instructions   None    ED Prescriptions    None     Controlled Substance  Prescriptions [place] Controlled Substance Registry consulted? Not Applicable   Kinnie Feil, MD 04/17/18 1346

## 2018-04-17 NOTE — Discharge Instructions (Addendum)
It was nice seeing you today. Your insect bite area does not look infected. We will treat with steroid and antihistamine. Please go to the ED if you have SOB, tongue swelling or throat swelling.

## 2018-08-26 ENCOUNTER — Other Ambulatory Visit: Payer: Self-pay | Admitting: Primary Care

## 2018-08-26 DIAGNOSIS — Z1231 Encounter for screening mammogram for malignant neoplasm of breast: Secondary | ICD-10-CM

## 2018-10-13 ENCOUNTER — Ambulatory Visit
Admission: RE | Admit: 2018-10-13 | Discharge: 2018-10-13 | Disposition: A | Discharge status: Home / Self Care | Payer: Medicare HMO | Source: Ambulatory Visit | Attending: Primary Care | Admitting: Primary Care

## 2018-10-13 DIAGNOSIS — Z1231 Encounter for screening mammogram for malignant neoplasm of breast: Secondary | ICD-10-CM

## 2018-12-03 ENCOUNTER — Ambulatory Visit (INDEPENDENT_AMBULATORY_CARE_PROVIDER_SITE_OTHER): Payer: Self-pay

## 2018-12-03 ENCOUNTER — Encounter (INDEPENDENT_AMBULATORY_CARE_PROVIDER_SITE_OTHER): Payer: Self-pay | Admitting: Family Medicine

## 2018-12-03 ENCOUNTER — Telehealth (INDEPENDENT_AMBULATORY_CARE_PROVIDER_SITE_OTHER): Payer: Self-pay

## 2018-12-03 ENCOUNTER — Ambulatory Visit (INDEPENDENT_AMBULATORY_CARE_PROVIDER_SITE_OTHER): Payer: Medicare HMO | Admitting: Family Medicine

## 2018-12-03 ENCOUNTER — Ambulatory Visit (INDEPENDENT_AMBULATORY_CARE_PROVIDER_SITE_OTHER): Payer: Medicare HMO

## 2018-12-03 DIAGNOSIS — M1711 Unilateral primary osteoarthritis, right knee: Secondary | ICD-10-CM | POA: Diagnosis not present

## 2018-12-03 DIAGNOSIS — M25561 Pain in right knee: Secondary | ICD-10-CM

## 2018-12-03 DIAGNOSIS — M25562 Pain in left knee: Secondary | ICD-10-CM

## 2018-12-03 DIAGNOSIS — M1712 Unilateral primary osteoarthritis, left knee: Secondary | ICD-10-CM

## 2018-12-03 MED ORDER — DICLOFENAC SODIUM 1 % TD GEL: 4.0000 g | Freq: Four times a day (QID) | TRANSDERMAL | 6 refills | Status: DC | PRN

## 2018-12-03 NOTE — Progress Notes (Signed)
Office Visit Note   Patient: [name]           Date of Birth: [birth year]           MRN: [id number] Visit Date: 12/03/2018 Requested by: Tsosie Billing, MD [place], [place] [address] PCP: Tsosie Billing, MD  Subjective: Chief Complaint  Patient presents with  . Right Knee - Pain  . Left Knee - Pain    HPI: She is a 63 year old with left greater than right knee pain.  Longstanding problems with her knees.  She had left knee scope many years ago at [place] which unfortunately did not provide much relief.  She has been managing her pain with a neoprene sleeve.  She prefers not to take any medication.  She has never had a cortisone injection and is very reluctant to have cortisone.  Lately her left knee seems to be bothering her much more and she would like to try something different.  She does not feel like she is ready for surgery yet.  She is otherwise in good health.  Her 105 year old mother is here with her today and is in excellent health, and does not have any arthritic problems with her knees.               ROS: There is a history of treated vitamin D deficiency, hyperlipidemia and prediabetes.  She eats healthfully now.  She has hypertension well controlled.  Other systems were negative.  Objective: Vital Signs: There were no vitals taken for this visit.  Physical Exam:  Knees: 1+ effusion in both knees with no warmth or erythema.  No pain with internal hip rotation bilaterally.  Full active extension and flexion of 130 degrees in both knees.  Slight tenderness on the medial and lateral joint lines but no palpable click with McMurray's.  Ligaments feel stable.  Imaging: X-rays both knees: Bone-on-bone medial and almost lateral compartment DJD in both knees with moderate patellofemoral DJD as well.  Assessment & Plan: 1.   Chronic left greater than right knee pain due to end-stage osteoarthritis -We will try a new  neoprene sleeve for the left knee.  Glucosamine and turmeric over-the-counter.  Voltaren gel topically.  She also wants to get approval for gel injections for the left knee.   Follow-Up Instructions: No follow-ups on file.      Procedures: No procedures performed  No notes on file    PMFS History: Patient Active Problem List   Diagnosis Date Noted  . Encounter for Medicare annual wellness exam 04/30/2016  . Prediabetes 03/11/2014  . Hyperlipidemia   . Hypertension   . Vitamin D deficiency   . TBI (traumatic brain injury) [facility])    Past Medical History:  Diagnosis Date  . Arthritis   . Hyperlipidemia   . Hypertension   . Prediabetes   . Seizures ([place])   . TBI (traumatic brain injury) [facility])    Memory issues   . Vitamin D deficiency     Family History  Problem Relation Age of Onset  . Hypertension Mother   . Diabetes Mother   . Heart disease Father     Past Surgical History:  Procedure Laterality Date  . ABDOMINAL HYSTERECTOMY     BSO  . KNEE ARTHROSCOPY Right    Social History   Occupational History  . Not on file  Tobacco Use  . Smoking status: Never Smoker  . Smokeless tobacco: Never Used  Substance and Sexual  Activity  . Alcohol use: No  . Drug use: No  . Sexual activity: Not on file

## 2018-12-03 NOTE — Telephone Encounter (Signed)
Gel injection for left knee OA per Dr. Junius Roads

## 2018-12-03 NOTE — Patient Instructions (Signed)
   Glucosamine Sulfate:  1,000 mg twice daily  Turmeric capsules:  500 mg twice daily

## 2018-12-05 NOTE — Telephone Encounter (Signed)
Noted  

## 2018-12-08 ENCOUNTER — Telehealth (INDEPENDENT_AMBULATORY_CARE_PROVIDER_SITE_OTHER): Payer: Self-pay

## 2018-12-08 NOTE — Telephone Encounter (Signed)
Submitted VOB for Monovisc, left knee. 

## 2018-12-24 ENCOUNTER — Encounter (INDEPENDENT_AMBULATORY_CARE_PROVIDER_SITE_OTHER): Payer: Self-pay | Admitting: Radiology

## 2018-12-24 NOTE — Progress Notes (Signed)
IC patient and LM advising that she can call office to sched Monovisc injection left knee if she wants to proceed.  Insurance pays 100%, buy and bill. [place].

## 2018-12-31 ENCOUNTER — Ambulatory Visit (INDEPENDENT_AMBULATORY_CARE_PROVIDER_SITE_OTHER): Payer: Medicare HMO | Admitting: Family Medicine

## 2018-12-31 ENCOUNTER — Other Ambulatory Visit: Payer: Self-pay

## 2018-12-31 ENCOUNTER — Encounter (INDEPENDENT_AMBULATORY_CARE_PROVIDER_SITE_OTHER): Payer: Self-pay | Admitting: Family Medicine

## 2018-12-31 DIAGNOSIS — M1712 Unilateral primary osteoarthritis, left knee: Secondary | ICD-10-CM

## 2018-12-31 DIAGNOSIS — G8929 Other chronic pain: Secondary | ICD-10-CM | POA: Diagnosis not present

## 2018-12-31 DIAGNOSIS — M25562 Pain in left knee: Principal | ICD-10-CM

## 2018-12-31 NOTE — Progress Notes (Signed)
Subjective: She is here for planned Monovisc injection for left knee osteoarthritis.  Objective: Trace effusion, no warmth or erythema.  Procedure: Left knee Monovisc injection: After sterile prep with Betadine, injected 3 cc 1% lidocaine without epinephrine then Monovisc from superolateral approach, a flash of blood-tinged synovial fluid obtained prior to injection.

## 2019-01-01 ENCOUNTER — Telehealth (INDEPENDENT_AMBULATORY_CARE_PROVIDER_SITE_OTHER): Payer: Self-pay

## 2019-01-01 NOTE — Telephone Encounter (Signed)
Noted.  Will submit.  

## 2019-01-01 NOTE — Telephone Encounter (Signed)
-----   Message from Eunice Blase, MD sent at 12/31/2018 10:56 AM EDT ----- Regarding: Gel injection right knee Please obtain approval for right knee gel injection.

## 2019-01-02 ENCOUNTER — Telehealth (INDEPENDENT_AMBULATORY_CARE_PROVIDER_SITE_OTHER): Payer: Self-pay

## 2019-01-02 NOTE — Telephone Encounter (Signed)
Submitted VOB for Monovisc, right knee. 

## 2019-01-07 ENCOUNTER — Telehealth (INDEPENDENT_AMBULATORY_CARE_PROVIDER_SITE_OTHER): Payer: Self-pay

## 2019-01-07 NOTE — Telephone Encounter (Signed)
Talked with patient and advised her that she is approved for gel injection.  Approved for Monovisc, right knee. [place] Patient is covered at 100% through her insurance. No Co-pay No PA required  Appt.01/13/2019 with Dr. Junius Roads.

## 2019-01-13 ENCOUNTER — Ambulatory Visit (INDEPENDENT_AMBULATORY_CARE_PROVIDER_SITE_OTHER): Payer: Medicaid Other | Admitting: Family Medicine

## 2019-06-18 ENCOUNTER — Other Ambulatory Visit: Payer: Self-pay | Admitting: Family Medicine

## 2019-06-18 DIAGNOSIS — R5381 Other malaise: Secondary | ICD-10-CM

## 2019-09-14 ENCOUNTER — Other Ambulatory Visit: Payer: Self-pay | Admitting: Primary Care

## 2019-09-14 ENCOUNTER — Other Ambulatory Visit: Payer: Self-pay | Admitting: Family Medicine

## 2019-09-14 DIAGNOSIS — Z1231 Encounter for screening mammogram for malignant neoplasm of breast: Secondary | ICD-10-CM

## 2019-10-05 ENCOUNTER — Other Ambulatory Visit: Payer: Self-pay | Admitting: Family Medicine

## 2019-10-05 DIAGNOSIS — Z780 Asymptomatic menopausal state: Secondary | ICD-10-CM

## 2019-10-07 ENCOUNTER — Other Ambulatory Visit: Payer: Self-pay

## 2019-10-07 ENCOUNTER — Ambulatory Visit
Admission: RE | Admit: 2019-10-07 | Discharge: 2019-10-07 | Disposition: A | Discharge status: Home / Self Care | Payer: Medicare HMO | Source: Ambulatory Visit | Attending: Family | Admitting: Family

## 2019-10-07 DIAGNOSIS — Z780 Asymptomatic menopausal state: Secondary | ICD-10-CM

## 2019-11-03 ENCOUNTER — Other Ambulatory Visit: Payer: Self-pay

## 2019-11-03 ENCOUNTER — Ambulatory Visit
Admission: RE | Admit: 2019-11-03 | Discharge: 2019-11-03 | Disposition: A | Discharge status: Home / Self Care | Payer: Medicare HMO | Source: Ambulatory Visit | Attending: Family | Admitting: Family

## 2019-11-03 DIAGNOSIS — Z1231 Encounter for screening mammogram for malignant neoplasm of breast: Secondary | ICD-10-CM

## 2020-01-08 ENCOUNTER — Telehealth: Payer: Self-pay | Admitting: Internal Medicine

## 2020-01-08 NOTE — Telephone Encounter (Signed)
Pt requesting new patient appointment

## 2020-01-08 NOTE — Telephone Encounter (Signed)
Patient is calling to become a new patient Please advise Cb- [phone]

## 2020-01-28 NOTE — Telephone Encounter (Signed)
Pt has upcoming appt on 4/22

## 2020-02-04 ENCOUNTER — Other Ambulatory Visit: Payer: Self-pay

## 2020-02-04 ENCOUNTER — Encounter: Payer: Self-pay | Admitting: Family Medicine

## 2020-02-04 ENCOUNTER — Ambulatory Visit (INDEPENDENT_AMBULATORY_CARE_PROVIDER_SITE_OTHER): Payer: Medicare HMO | Admitting: Family Medicine

## 2020-02-04 VITALS — BP 130/62 | HR 79 | Temp 98.2°F | Ht 63.0 in | Wt 133.0 lb

## 2020-02-04 DIAGNOSIS — R7303 Prediabetes: Secondary | ICD-10-CM

## 2020-02-04 DIAGNOSIS — E559 Vitamin D deficiency, unspecified: Secondary | ICD-10-CM | POA: Diagnosis not present

## 2020-02-04 DIAGNOSIS — I10 Essential (primary) hypertension: Secondary | ICD-10-CM | POA: Diagnosis not present

## 2020-02-04 DIAGNOSIS — E7800 Pure hypercholesterolemia, unspecified: Secondary | ICD-10-CM | POA: Diagnosis not present

## 2020-02-04 DIAGNOSIS — E538 Deficiency of other specified B group vitamins: Secondary | ICD-10-CM

## 2020-02-04 NOTE — Patient Instructions (Signed)
° ° ° °  If you have lab work done today you will be contacted with your lab results within the next 2 weeks.  If you have not heard from us then please contact us. The fastest way to get your results is to register for My Chart. ° ° °IF you received an x-ray today, you will receive an invoice from [facility]. Please contact [facility] at [phone] with questions or concerns regarding your invoice.  ° °IF you received labwork today, you will receive an invoice from [facility]. Please contact [facility] at [phone] with questions or concerns regarding your invoice.  ° °Our billing staff will not be able to assist you with questions regarding bills from these companies. ° °You will be contacted with the lab results as soon as they are available. The fastest way to get your results is to activate your My Chart account. Instructions are located on the last page of this paperwork. If you have not heard from us regarding the results in 2 weeks, please contact this office. °  ° ° ° °

## 2020-02-04 NOTE — Progress Notes (Signed)
 4/22/20213:48 PM  [name] 10/22/1955, 64 y.o., female [id number]  Chief Complaint  Patient presents with  . Hypertension  . Prediabetes  . L/ R  knee arthritis    HPI:   Patient is a 64 y.o. female with past medical history significant for HTN, HLP, prediabetes, OA  Knees, TBI, vitamin D and B12 deficiences who presents today to establish care  Previous PCP Amanda Collier  Does not check cbgs regularly She takes vitamin D supplement She was told to stop b12 supplement as was having high levels, she stopped about 4 months, she is not vegan/vegatarian Uses a brace sleeve for her knees - provides relief TBI with loss memory about 2016 - no sequela anymore She stopped taking metoprolol a year ago as it was making her fell tired She also stopped taking atorvastatin about a month ago, was causing muscle pain  Depression screen PHQ 2/9 02/04/2020 04/30/2016 10/18/2015  Decreased Interest 0 0 0  Down, Depressed, Hopeless 0 0 0  PHQ - 2 Score 0 0 0    Fall Risk  02/04/2020 04/30/2016 10/18/2015  Falls in the past year? 0 No Yes  Number falls in past yr: 0 - 1  Injury with Fall? 0 - Yes  Risk Factor Category  - - High Fall Risk  Risk for fall due to : - - History of fall(s)  Follow up Falls evaluation completed - Falls evaluation completed;Education provided;Follow up appointment;Falls prevention discussed     No Known Allergies  Prior to Admission medications   Medication Sig Start Date End Date Taking? Authorizing Provider  Cholecalciferol (VITAMIN D PO) Take 5,000 Int'l Units by mouth daily.   Yes [provider]  irbesartan-hydrochlorothiazide (AVALIDE) 300-12.5 MG tablet  12/02/18  Yes [provider]  ACCU-CHEK AVIVA PLUS test strip  12/18/19   [provider]  Accu-Chek Softclix Lancets lancets  12/18/19   [provider]  Alcohol Swabs (B-D SINGLE USE SWABS REGULAR) PADS  12/18/19   [provider]  atorvastatin (LIPITOR)  10 MG tablet Take 10 mg by mouth daily.    [provider]  Blood Glucose Monitoring Suppl (ACCU-CHEK AVIVA PLUS) w/Device KIT  12/18/19   [provider]  Cyanocobalamin (B-12 PO) Take 2,500 mcg by mouth daily.    [provider]  diclofenac sodium (VOLTAREN) 1 % GEL Apply 4 g topically 4 (four) times daily as needed. 12/03/18   Hilts, Michael, MD  metoprolol succinate (TOPROL-XL) 50 MG 24 hr tablet Take 50 mg by mouth daily. Take with or immediately following a meal.    [provider]    Past Medical History:  Diagnosis Date  . Arthritis    L knee  . Hyperlipidemia   . Hypertension   . Prediabetes   . Seizures (HCC)   . TBI (traumatic brain injury) (HCC)    Memory issues   . Vitamin D deficiency     Past Surgical History:  Procedure Laterality Date  . ABDOMINAL HYSTERECTOMY     BSO  . BREAST BIOPSY Left   . KNEE ARTHROSCOPY Right     Social History   Tobacco Use  . Smoking status: Never Smoker  . Smokeless tobacco: Never Used  Substance Use Topics  . Alcohol use: No    Family History  Problem Relation Age of Onset  . Hypertension Mother   . Diabetes Mother   . Heart disease Father   . Cancer Brother   . Kidney disease   Other     Review of Systems  Constitutional: Negative for chills and fever.  Respiratory: Negative for cough and shortness of breath.   Cardiovascular: Negative for chest pain, palpitations and leg swelling.  Gastrointestinal: Negative for abdominal pain, nausea and vomiting.   Per hpi  OBJECTIVE:  Today's Vitals   02/04/20 1523 02/04/20 1540  BP: (!) 180/80 130/62  Pulse: 79   Temp: 98.2 F (36.8 C)   SpO2: 100%   Weight: 133 lb (60.3 kg)   Height: 5' 3" (1.6 m)    Body mass index is 23.56 kg/m.   Physical Exam Vitals and nursing note reviewed.  Constitutional:      Appearance: She is well-developed.  HENT:     Head: Normocephalic and atraumatic.     Mouth/Throat:     Pharynx: No  oropharyngeal exudate.  Eyes:     General: No scleral icterus.    Conjunctiva/sclera: Conjunctivae normal.     Pupils: Pupils are equal, round, and reactive to light.  Cardiovascular:     Rate and Rhythm: Normal rate and regular rhythm.     Heart sounds: Normal heart sounds. No murmur. No friction rub. No gallop.   Pulmonary:     Effort: Pulmonary effort is normal.     Breath sounds: Normal breath sounds. No wheezing or rales.  Musculoskeletal:     Cervical back: Neck supple.     Right lower leg: No edema.     Left lower leg: No edema.  Skin:    General: Skin is warm and dry.  Neurological:     Mental Status: She is alert and oriented to person, place, and time.     No results found for this or any previous visit (from the past 24 hour(s)).  No results found.   ASSESSMENT and PLAN  1. Essential hypertension Controlled. Continue current regime.  - CBC - Comprehensive metabolic panel  2. Pure hypercholesterolemia Checking labs today, medications will be adjusted as needed. Consider changing to either pravastatin or crestor as less likely to cause myalgia - Lipid panel  3. Prediabetes Checking labs today, medications will be started as needed.  - Hemoglobin A1c  4. Vitamin D deficiency Checking labs today, medications will be adjusted as needed.  - Vitamin D, 25-hydroxy  5. Vitamin B12 deficiency Checking labs today, medications will be adjusted as needed.  - Vitamin B12  Other orders - Blood Glucose Monitoring Suppl (ACCU-CHEK AVIVA PLUS) w/Device KIT - ACCU-CHEK AVIVA PLUS test strip - Accu-Chek Softclix Lancets lancets - Alcohol Swabs (B-D SINGLE USE SWABS REGULAR) PADS  Return in about 6 months (around 08/05/2020).    Irma M Santiago, MD [facility] [address] [place], [place] [address] Ph.  [phone] Fax [phone]   

## 2020-02-05 LAB — COMPREHENSIVE METABOLIC PANEL
ALT: 13 IU/L (ref 0–32)
AST: 20 IU/L (ref 0–40)
Albumin/Globulin Ratio: 1.5 (ref 1.2–2.2)
Albumin: 4.6 g/dL (ref 3.8–4.8)
Alkaline Phosphatase: 95 IU/L (ref 39–117)
BUN/Creatinine Ratio: 19 (ref 12–28)
BUN: 15 mg/dL (ref 8–27)
Bilirubin Total: 0.7 mg/dL (ref 0.0–1.2)
CO2: 28 mmol/L (ref 20–29)
Calcium: 9.8 mg/dL (ref 8.7–10.3)
Chloride: 102 mmol/L (ref 96–106)
Creatinine, Ser: 0.81 mg/dL (ref 0.57–1.00)
GFR calc Af Amer: 89 mL/min/{1.73_m2} (ref >59)
GFR calc non Af Amer: 78 mL/min/{1.73_m2} (ref >59)
Globulin, Total: 3.1 g/dL (ref 1.5–4.5)
Glucose: 85 mg/dL (ref 65–99)
Potassium: 4.1 mmol/L (ref 3.5–5.2)
Sodium: 141 mmol/L (ref 134–144)
Total Protein: 7.7 g/dL (ref 6.0–8.5)

## 2020-02-05 LAB — LIPID PANEL
Chol/HDL Ratio: 2.4 ratio (ref 0.0–4.4)
Cholesterol, Total: 167 mg/dL (ref 100–199)
HDL: 70 mg/dL (ref >39)
LDL Chol Calc (NIH): 85 mg/dL (ref 0–99)
Triglycerides: 62 mg/dL (ref 0–149)
VLDL Cholesterol Cal: 12 mg/dL (ref 5–40)

## 2020-02-05 LAB — CBC
Hematocrit: 38.5 % (ref 34.0–46.6)
Hemoglobin: 13.2 g/dL (ref 11.1–15.9)
MCH: 31.5 pg (ref 26.6–33.0)
MCHC: 34.3 g/dL (ref 31.5–35.7)
MCV: 92 fL (ref 79–97)
Platelets: 183 10*3/uL (ref 150–450)
RBC: 4.19 x10E6/uL (ref 3.77–5.28)
RDW: 11.6 % — ABNORMAL LOW (ref 11.7–15.4)
WBC: 4.1 10*3/uL (ref 3.4–10.8)

## 2020-02-05 LAB — HEMOGLOBIN A1C
Est. average glucose Bld gHb Est-mCnc: 128 mg/dL
Hgb A1c MFr Bld: 6.1 % — ABNORMAL HIGH (ref 4.8–5.6)

## 2020-02-05 LAB — VITAMIN B12: Vitamin B-12: 588 pg/mL (ref 232–1245)

## 2020-02-05 LAB — VITAMIN D 25 HYDROXY (VIT D DEFICIENCY, FRACTURES): Vit D, 25-Hydroxy: 58.5 ng/mL (ref 30.0–100.0)

## 2020-02-11 ENCOUNTER — Telehealth: Payer: Self-pay | Admitting: Family Medicine

## 2020-02-11 NOTE — Telephone Encounter (Signed)
Pt would like a call concerning her most recent labs. Please advise at [phone].

## 2020-02-12 ENCOUNTER — Encounter: Payer: Self-pay | Admitting: Emergency Medicine

## 2020-02-12 ENCOUNTER — Telehealth: Payer: Self-pay

## 2020-02-12 NOTE — Telephone Encounter (Signed)
Patient's concern/request has been addressed via result note 

## 2020-02-12 NOTE — Telephone Encounter (Signed)
Pt called requesting her lab results I see a few abnormal results but nothing noted by you, is there anything she needs to be advised of?

## 2020-02-12 NOTE — Progress Notes (Signed)
Lab letter has been mailed

## 2020-02-26 ENCOUNTER — Telehealth: Payer: Self-pay

## 2020-02-26 NOTE — Telephone Encounter (Signed)
Pt called to get understanding of the abnormal reading of her lab results. Pt verbalized understanding with her prediabetic reading and will adapt lifestyle changes to get those results in normal range

## 2020-03-27 ENCOUNTER — Ambulatory Visit (INDEPENDENT_AMBULATORY_CARE_PROVIDER_SITE_OTHER): Payer: Medicare HMO

## 2020-03-27 ENCOUNTER — Encounter (HOSPITAL_COMMUNITY): Payer: Self-pay

## 2020-03-27 ENCOUNTER — Other Ambulatory Visit: Payer: Self-pay

## 2020-03-27 ENCOUNTER — Ambulatory Visit (HOSPITAL_COMMUNITY)
Admission: EM | Admit: 2020-03-27 | Discharge: 2020-03-27 | Disposition: A | Discharge status: Home / Self Care | Payer: Medicare HMO | Attending: Family Medicine | Admitting: Family Medicine

## 2020-03-27 DIAGNOSIS — S4992XA Unspecified injury of left shoulder and upper arm, initial encounter: Secondary | ICD-10-CM | POA: Diagnosis not present

## 2020-03-27 DIAGNOSIS — M25512 Pain in left shoulder: Secondary | ICD-10-CM | POA: Diagnosis not present

## 2020-03-27 DIAGNOSIS — G8929 Other chronic pain: Secondary | ICD-10-CM

## 2020-03-27 MED ORDER — MELOXICAM 7.5 MG PO TABS: 7.5000 mg | ORAL_TABLET | Freq: Every day | ORAL | 0 refills | Status: DC

## 2020-03-27 NOTE — ED Triage Notes (Signed)
Pt c/o recurrent left shoulder pain for approx 1 year. Pt states pain increases with arm movement/lifting and feels "out of place and pops". Pt states that approx 1 year ago she was pulling a lawnmower up a hill backwards and noticed shoulder pain after that. Pt reports limited ROM with left arm and is left arm/hand dominant. Left shoulder appears lower in horizontal plane than right shoulder; pt walks with a limp and states she formerly wore a back brace for a back injury. Denies numbness/tingling to left hand/fingers, hand/fingers warm with brisk cap refill, +2 radial pulse. Denies CP, SOB, nausea, diaphoresis or c/os. Pt states she has been applying heat therapy w/o improvement. Denies OTC medications or topical for symptoms.

## 2020-03-27 NOTE — ED Provider Notes (Signed)
[facility]    CSN: [id number] Arrival date & time: 03/27/20  1401      History   Chief Complaint Chief Complaint  Patient presents with  . Shoulder Pain    HPI [name] is a 64 y.o. female.   Patient is a 64 year old female past medical history of arthritis, hyperlipidemia, hypertension, prediabetes, seizures, traumatic brain injury.  She presents today with worsening left shoulder pain.  She has had issues with his left shoulder for approximately 1 year started after injury with a lawnmower.  For the past 2 weeks it has gotten increasingly worse with limited range of motion.  She has been doing heating pad without much relief.  Has not taken anything for the pain.  Pain does not radiate and there is no numbness or tingling. No chest pain, SOB  ROS per HPI      Past Medical History:  Diagnosis Date  . Arthritis    L knee  . Hyperlipidemia   . Hypertension   . Prediabetes   . Seizures ([place])   . TBI (traumatic brain injury) [facility])    Memory issues   . Vitamin D deficiency     Patient Active Problem List   Diagnosis Date Noted  . Encounter for Medicare annual wellness exam 04/30/2016  . Prediabetes 03/11/2014  . Hyperlipidemia   . Hypertension   . Vitamin D deficiency   . TBI (traumatic brain injury) [facility])     Past Surgical History:  Procedure Laterality Date  . ABDOMINAL HYSTERECTOMY     BSO  . BREAST BIOPSY Left   . KNEE ARTHROSCOPY Right     OB History   No obstetric history on file.      Home Medications    Prior to Admission medications   Medication Sig Start Date End Date Taking? Authorizing Provider  Blood Glucose Monitoring Suppl (ACCU-CHEK AVIVA PLUS) w/Device KIT  12/18/19  Yes [provider]  Cyanocobalamin (B-12 PO) Take 2,500 mcg by mouth daily.   Yes [provider]  irbesartan-hydrochlorothiazide (AVALIDE) 300-12.5 MG tablet  12/02/18  Yes [provider]  [place] test strip   12/18/19   [provider]  Accu-Chek Softclix Lancets lancets  12/18/19   [provider]  Alcohol Swabs (B-D SINGLE USE SWABS REGULAR) PADS  12/18/19   [provider]  Cholecalciferol (VITAMIN D PO) Take 5,000 Int'l Units by mouth daily.    [provider]  diclofenac sodium (VOLTAREN) 1 % GEL Apply 4 g topically 4 (four) times daily as needed. 12/03/18   Hilts, Legrand Como, MD  meloxicam (MOBIC) 7.5 MG tablet Take 1 tablet (7.5 mg total) by mouth daily. 03/27/20   Orvan July, NP    Family History Family History  Problem Relation Age of Onset  . Hypertension Mother   . Diabetes Mother   . Heart disease Father   . Cancer Brother   . Kidney disease Other     Social History Social History   Tobacco Use  . Smoking status: Never Smoker  . Smokeless tobacco: Never Used  Vaping Use  . Vaping Use: Never used  Substance Use Topics  . Alcohol use: No  . Drug use: No     Allergies   Patient has no known allergies.   Review of Systems Review of Systems   Physical Exam Triage Vital Signs ED Triage Vitals [03/27/20 1416]  Enc Vitals Group     BP 126/66  Pulse Rate 77     Resp 18     Temp 99.6 F (37.6 C)     Temp Source Oral     SpO2 100 %     Weight      Height      Head Circumference      Peak Flow      Pain Score      Pain Loc      Pain Edu?      Excl. in [place]?    No data found.  Updated Vital Signs BP 126/66 (BP Location: Right Arm)   Pulse 77   Temp 99.6 F (37.6 C) (Oral)   Resp 18   SpO2 100%   Visual Acuity Right Eye Distance:   Left Eye Distance:   Bilateral Distance:    Right Eye Near:   Left Eye Near:    Bilateral Near:     Physical Exam Vitals and nursing note reviewed.  Constitutional:      General: She is not in acute distress.    Appearance: Normal appearance. She is not ill-appearing, toxic-appearing or diaphoretic.  HENT:     Head: Normocephalic.     Nose: Nose normal.  Eyes:      Conjunctiva/sclera: Conjunctivae normal.  Pulmonary:     Effort: Pulmonary effort is normal.  Musculoskeletal:        General: Normal range of motion.     Cervical back: Normal range of motion.     Comments: Left shoulder atrophy. Very limited ROM especially with rotation.  No swelling.    Skin:    General: Skin is warm and dry.     Findings: No rash.  Neurological:     Mental Status: She is alert.  Psychiatric:        Mood and Affect: Mood normal.      UC Treatments / Results  Labs (all labs ordered are listed, but only abnormal results are displayed) Labs Reviewed - No data to display  EKG   Radiology DG Shoulder Left  Result Date: 03/27/2020 CLINICAL DATA:  Progressive pain EXAM: LEFT SHOULDER - 2+ VIEW COMPARISON:  None. FINDINGS: Frontal, oblique, and Y scapular images were obtained. No fracture or dislocation. Joint spaces appear normal. No erosive change or intra-articular calcification. Visualized left lung clear. IMPRESSION: No fracture or dislocation.  No evident arthropathy. Electronically Signed   By: Lowella Grip III M.D.   On: 03/27/2020 15:12    Procedures Procedures (including critical care time)  Medications Ordered in UC Medications - No data to display  Initial Impression / Assessment and Plan / UC Course  I have reviewed the triage vital signs and the nursing notes.  Pertinent labs & imaging results that were available during my care of the patient were reviewed by me and considered in my medical decision making (see chart for details).     Chronic left shoulder pain. X-ray without anything concerning.   Most likely rotator cuff issue versus arthritis.  We will have her do meloxicam daily. Rest, ice Contact given for sports medicine and referral put in.   Final Clinical Impressions(s) / UC Diagnoses   Final diagnoses:  Chronic left shoulder pain     Discharge Instructions     Your x-ray did not show any acute problems.  Like we  talked about this is most likely some sort of rotator cuff issue Treating her pain with meloxicam daily.  Take this with food. You need to follow-up with orthopedic  specialist. I am referring you to sports medicine Rest, ice to the shoulder.    ED Prescriptions    Medication Sig Dispense Auth. Provider   meloxicam (MOBIC) 7.5 MG tablet Take 1 tablet (7.5 mg total) by mouth daily. 30 tablet Loura Halt A, NP     PDMP not reviewed this encounter.   Orvan July, NP 03/27/20 1545

## 2020-03-27 NOTE — Discharge Instructions (Addendum)
Your x-ray did not show any acute problems.  Like we talked about this is most likely some sort of rotator cuff issue Treating her pain with meloxicam daily.  Take this with food. You need to follow-up with orthopedic specialist. I am referring you to sports medicine Rest, ice to the shoulder.

## 2020-03-31 ENCOUNTER — Ambulatory Visit (INDEPENDENT_AMBULATORY_CARE_PROVIDER_SITE_OTHER): Payer: Medicare HMO | Admitting: Pediatrics

## 2020-03-31 ENCOUNTER — Other Ambulatory Visit: Payer: Self-pay

## 2020-03-31 VITALS — BP 132/70 | Ht 61.0 in | Wt 124.0 lb

## 2020-03-31 DIAGNOSIS — M75102 Unspecified rotator cuff tear or rupture of left shoulder, not specified as traumatic: Secondary | ICD-10-CM

## 2020-03-31 DIAGNOSIS — M25512 Pain in left shoulder: Secondary | ICD-10-CM

## 2020-03-31 NOTE — Progress Notes (Signed)
[name] - 64 y.o. female MRN [id number]  Date of birth: 13-Jun-1956  Office Visit Note: Visit Date: 03/31/2020 PCP: Rutherford Guys, MD Referred by: Orvan July, NP  Subjective: CC: left shoulder pain  HPI: [name] is a 64 y.o. female with hx of arthritis, HTN, hyperlipidemia, and prediabetes who comes in today with 1 year of left shoulder pain that has acutely worsened in the last 2 weeks. She says it mainly bothers her with reaching overhead and reaching behind her back. She describes the pain as radiating down her arm and says it sometimes feels like she has a knot in her shoulder/axilla. She initially felt pain in her shoulder 2 years ago after feeling acute pain when pulling a lawn mower; however, this resolved quickly without intervention. The use of a heating pad has helped a bit, but the relief is short lived. She tried meloxicam, but it was too hard on her stomach, so she stopped.   ROS Otherwise per HPI.  Assessment & Plan: Visit Diagnoses:  1. Pain in joint of left shoulder   2. Nontraumatic tear of supraspinatus tendon, left     Plan: [name] is a 64 y.o. female with 1 year of left shoulder pain likely secondary to high grade partial supraspinatus tear noted on ultrasound. She has decent strength on testing but has pain and signs of impingement and early frozen shoulder as well. Will refer to [facility] to work with Linton Rump on rotator cuff rehab. Will re-evaluate in 6 weeks. Suspect this may be slow to heal as it is a high grade partial thickness tear, but given chronicity of pain I do not think she is a surgical candidate at this time and would be better suited for conservative management.     Follow-up: 6 weeks  I was the preceptor for this visit and available for immediate consultation Shellia Cleverly, DO   Clinical History: No specialty comments available.   She reports that she has never smoked. She has never used  smokeless tobacco.  Recent Labs    02/04/20 1620  HGBA1C 6.1*    Objective:  VS:  HT:5\' 1"  (154.9 cm)   WT:124 lb (56.2 kg)  BMI:23.44    BP:132/70  HR: bpm  TEMP: ( )  RESP:  Physical Exam  PHYSICAL EXAM: Gen: NAD, alert, cooperative with exam, well-appearing HEENT: clear conjunctiva,  CV:  no edema, capillary refill brisk, normal rate Resp: non-labored Skin: no rashes, normal turgor  Neuro: no gross deficits.  Psych:  alert and oriented  Ortho Exam  Left Shoulder: Inspection reveals no obvious deformity, atrophy, or asymmetry. No bruising. No swelling Palpation is normal with no TTP over [facility] joint or bicipital groove. Flexion, abduction limited to 90 degrees both passively and actively. IR limited. ER to 45 NV intact distally Normal scapular function observed. Special Tests:  - Impingement: Positive Hawkins, neers, empty can sign. - Supraspinatous: Positive empty can.  5/5 strength with resisted flexion at 20 degrees, but with pain  - Infraspinatous/Teres Minor: 5/5 strength with ER - Subscapularis: Positive belly press. 5/5 strength with IR - Biceps tendon: Positive Speeds, Yerrgason's  - AC Joint: Negative cross arm   Imaging: ULTRASOUND: Shoulder, left Diagnostic complete ultrasound imaging obtained of patient's left shoulder.  - No obvious evidence of bony deformity or osteophyte development appreciated.  - Long head of the biceps tendon: No evidence of tendon thickening, calcification, subluxation, or tearing in short or long  axis views. Surrounding edema with positive bullseye sign.  - Subscapularis tendon: normal - Supraspinatus tendon: complete visualization across the width of the insertion point yielded high grade partial tear and anterior footplate. - Infraspinatus and teres minor tendons: visualization across the width of the insertion points yielded no evidence of tendon thickening, calcification, or tears in the long axis view.  [facility] Joint: No evidence  of joint separation, collapse. Osteophyte development appreciated with effusion (mushroom sign)  IMPRESSION: findings consistent with high grade partial supraspinatus tear     Past Medical/Family/Surgical/Social History: Medications & Allergies reviewed per EMR, new medications updated. Patient Active Problem List   Diagnosis Date Noted  . Encounter for Medicare annual wellness exam 04/30/2016  . Prediabetes 03/11/2014  . Hyperlipidemia   . Hypertension   . Vitamin D deficiency   . TBI (traumatic brain injury) [facility])    Past Medical History:  Diagnosis Date  . Arthritis    L knee  . Hyperlipidemia   . Hypertension   . Prediabetes   . Seizures ([place])   . TBI (traumatic brain injury) [facility])    Memory issues   . Vitamin D deficiency    Family History  Problem Relation Age of Onset  . Hypertension Mother   . Diabetes Mother   . Heart disease Father   . Cancer Brother   . Kidney disease Other    Past Surgical History:  Procedure Laterality Date  . ABDOMINAL HYSTERECTOMY     BSO  . BREAST BIOPSY Left   . KNEE ARTHROSCOPY Right    Social History   Occupational History  . Not on file  Tobacco Use  . Smoking status: Never Smoker  . Smokeless tobacco: Never Used  Vaping Use  . Vaping Use: Never used  Substance and Sexual Activity  . Alcohol use: No  . Drug use: No  . Sexual activity: Not on file

## 2020-03-31 NOTE — Patient Instructions (Signed)
You have a tear in your supraspinatus (a rotator cuff muscle in your shoulder).  This is causing pain in your shoulder.  We will send you to [facility] for rehab to give you exercises to strengthen your shoulder.  Ice and take tylenol as needed for pain.  Return in 6 weeks for re-evaluation.

## 2020-04-15 ENCOUNTER — Encounter: Payer: Self-pay | Admitting: Rehabilitative and Restorative Service Providers"

## 2020-04-15 ENCOUNTER — Ambulatory Visit: Payer: Medicare HMO | Attending: Sports Medicine | Admitting: Rehabilitative and Restorative Service Providers"

## 2020-04-15 ENCOUNTER — Other Ambulatory Visit: Payer: Self-pay

## 2020-04-15 DIAGNOSIS — M25512 Pain in left shoulder: Secondary | ICD-10-CM | POA: Insufficient documentation

## 2020-04-15 DIAGNOSIS — G8929 Other chronic pain: Secondary | ICD-10-CM | POA: Diagnosis not present

## 2020-04-15 DIAGNOSIS — M6281 Muscle weakness (generalized): Secondary | ICD-10-CM | POA: Insufficient documentation

## 2020-04-15 NOTE — Therapy (Signed)
[place], [place], [address] Phone: [phone]   Fax:  [phone]  Physical Therapy Evaluation  Patient Details  Name: [name] MRN: [id number] Date of Birth: [birth year] Referring Provider (PT): Rudean Hitt Date: 04/15/2020   PT End of Session - 04/15/20 0906    Visit Number 1    Number of Visits 12    Date for PT Re-Evaluation 05/27/20    Authorization Type [place] [employer]    Progress Note Due on Visit 10    PT Start Time 0908    PT Stop Time 1001    PT Time Calculation (min) 53 min    Activity Tolerance Patient tolerated treatment well;No increased pain    Behavior During Therapy WFL for tasks assessed/performed           Past Medical History:  Diagnosis Date  . Arthritis    L knee  . Hyperlipidemia   . Hypertension   . Prediabetes   . Seizures ([place])   . TBI (traumatic brain injury) [facility])    Memory issues   . Vitamin D deficiency     Past Surgical History:  Procedure Laterality Date  . ABDOMINAL HYSTERECTOMY     BSO  . BREAST BIOPSY Left   . KNEE ARTHROSCOPY Right     There were no vitals filed for this visit.    Subjective Assessment - 04/15/20 0905    Subjective No idea how my shoulder started hurting this time. Dr didn't tell me not to do anything.    Pertinent History History of 1 year left shoulder pain worsening over last 2 weeks. Initially hurt shoulder 2 years ago pulling a lawnmower but resolved without treatment. Prediabetic, L knee arthritis    Limitations Writing;House hold activities;Lifting    How long can you sit comfortably? no trouble    How long can you stand comfortably? no trouble    How long can you walk comfortably? no trouble.    Diagnostic tests [place]: nontraumatic tear of supraspinatus tendon L; partial tear    Patient Stated Goals to do all my activities without pain.    Currently in Pain? Yes    Pain Score 6     Pain Location  Shoulder    Pain Orientation Left    Pain Descriptors / Indicators Aching    Pain Type Chronic pain    Pain Radiating Towards L elbow    Pain Onset 1 to 4 weeks ago    Pain Frequency Constant    Aggravating Factors  using arm    Pain Relieving Factors rest    Effect of Pain on Daily Activities doing more activities with right arm due to pain with usage of left arm    Multiple Pain Sites No              [facility] PT Assessment - 04/15/20 0001      Assessment   Medical Diagnosis L partial RCT (supraspinatus tendon)    Referring Provider (PT) Draper    Onset Date/Surgical Date 03/28/20    Hand Dominance Left    Next MD Visit 05/12/20    Prior Therapy none      Precautions   Precautions None    Precaution Comments difficulty in reading      Restrictions   Weight Bearing Restrictions No      Balance Screen   Has the patient fallen in the past 6 months No  Has the patient had a decrease in activity level because of a fear of falling?  No    Is the patient reluctant to leave their home because of a fear of falling?  No      Home Ecologist residence      Prior Function   Level of Independence Independent with basic ADLs      Cognition   Overall Cognitive Status Within Functional Limits for tasks assessed      Observation/Other Assessments   Observations pt very limited in range of motion when handing PT written information    Focus on Therapeutic Outcomes (FOTO)  60% limited    Other Surveys  --   Neers +, Aeronautical engineer +     Sensation   Light Touch Appears Intact      Coordination   Gross Motor Movements are Fluid and Coordinated No      Posture/Postural Control   Posture Comments rounded shoulders, forward head, stance with slight knee flexion L > R; L scapula lower      ROM / Strength   AROM / PROM / Strength AROM;PROM;Strength      AROM   Overall AROM Comments cervical limited: flexion 25%, rotation 50% bil, ext 75%. R shoulder flex  WNL, ER to T3, IR to T7, abdct WNL    AROM Assessment Site Shoulder    Right/Left Shoulder Left    Left Shoulder Flexion 75 Degrees    Left Shoulder ABduction 54 Degrees    Left Shoulder Internal Rotation --   L3 sitting   Left Shoulder External Rotation --   T1 sitting, supine 21 degrees     PROM   Overall PROM Comments pt unable to relax for PROM assessment      Strength   Overall Strength Comments flex 4+/5, abdct 3+/5, IR/ER 4-/5, elbow flex 4+/5, elbow ext 3/5. All to R side    Strength Assessment Site Shoulder    Right/Left Shoulder Left    Left Shoulder Flexion 2-/5    Left Shoulder ABduction 2-/5    Left Shoulder Internal Rotation 2/5    Left Shoulder External Rotation 2/5                      Objective measurements completed on examination: See above findings.               PT Education - 04/15/20 1011    Education Details Access code: [email or web address]; stressed to patient that she is to experience no pain with HEP. pendulums laterally, flex/ext, circular both directions; scap retract and supine L ER with stick all 2x/day 10 reps 1 set.    Person(s) Educated Patient    Methods Explanation;Demonstration;Handout    Comprehension Verbalized understanding;Returned demonstration            PT Short Term Goals - 04/15/20 1019      PT SHORT TERM GOAL #1   Title Pt will be I with HEP    Baseline issued at eval    Time 3    Period Weeks    Status New    Target Date 05/06/20      PT SHORT TERM GOAL #2   Title Pt will demo improved AROM shoulder flexion L seated to 100 degrees to assist with reaching    Baseline 75 degrees    Time 3    Period Weeks    Status New    Target Date 05/06/20  PT SHORT TERM GOAL #3   Title Pt will demo improved L shoulder abduction to 80 degrees to assist with dressing    Baseline 54 degrees    Time 3    Period Weeks    Status New    Target Date 05/06/20             PT Long Term Goals - 04/15/20 1021       PT LONG TERM GOAL #1   Title Pt will demo improved strength of L shoulder flexion/abduction to 3+/5 to assist with household chores    Baseline 2-/5 flex/abdct    Time 6    Period Weeks    Status New    Target Date 05/27/20      PT LONG TERM GOAL #2   Title Pt will be able to brush her teeth with her L hand without difficulty    Baseline unable    Time 6    Period Weeks    Status New    Target Date 05/27/20      PT LONG TERM GOAL #3   Title Pt will be able to perform sweeping/vacuuming with </= 4/10 L shoulder    Baseline unable to perform at all    Time 6    Period Weeks    Status New    Target Date 05/27/20      PT LONG TERM GOAL #4   Title Pt will be able to comb hair with 75% less difficulty    Baseline unable    Time 6    Period Weeks    Status New    Target Date 05/27/20      PT LONG TERM GOAL #5   Title Pt will have improved foto score to 31% limited    Baseline 60%    Time 6    Period Weeks    Status New    Target Date 05/27/20                  Plan - 04/15/20 1006    Clinical Impression Statement Pt presents to PT with complaints of L shoulder pain due to L nontraumatic tear of supraspinatus (partial tear) noted on [place]. She shows signs of impingement and early frozen shoulder per MD note. She has decreased ROM and strength L shoulder. She is guarded with PROM. Pt would benefit from PT for L shoulder ROM, strengthening, modalities, and manual therapy as needed. Pt has difficulty reading    Personal Factors and Comorbidities Comorbidity 1    Comorbidities L supraspinatus partial tear    Examination-Activity Limitations Bathing;Hygiene/Grooming;Lift;Locomotion Level;Reach Overhead;Toileting;Dressing;Sleep    Examination-Participation Restrictions Laundry;Shop;Cleaning;Community Activity    Stability/Clinical Decision Making Stable/Uncomplicated    Clinical Decision Making Low    Rehab Potential Fair    PT Frequency 2x / week    PT Duration 6 weeks     PT Treatment/Interventions Cryotherapy;Ultrasound;Moist Heat;Iontophoresis 4mg /ml Dexamethasone;Electrical Stimulation;Functional mobility training;Therapeutic exercise;Therapeutic activities;Patient/family education;Manual techniques;Passive range of motion;Dry needling    PT Next Visit Plan Review HEP, wand exercises; monitor for pain; work on ROM L shoulder    PT Home Exercise Plan Access code: [email or web address]; stressed to patient that she is to experience no pain with HEP. pendulums laterally, flex/ext, circular both directions; scap retract and supine L ER with stick all 2x/day 10 reps 1 set.    Recommended Other Services Issued advanced directive flyer    Consulted and Agree with Plan of Care Patient  Patient will benefit from skilled therapeutic intervention in order to improve the following deficits and impairments:  Decreased endurance, Decreased mobility, Hypomobility, Decreased range of motion, Decreased activity tolerance, Decreased strength, Impaired flexibility, Impaired UE functional use, Postural dysfunction  Visit Diagnosis: Chronic left shoulder pain  Muscle weakness (generalized)     Problem List Patient Active Problem List   Diagnosis Date Noted  . Encounter for Medicare annual wellness exam 04/30/2016  . Prediabetes 03/11/2014  . Hyperlipidemia   . Hypertension   . Vitamin D deficiency   . TBI (traumatic brain injury) [facility])     Cristiana Yochim, PT 04/15/2020, 10:28 AM  [facility] [address] [place], [place], [address] Phone: [phone]   Fax:  [phone]  Name: [name] MRN: [id number] Date of Birth: 06/30/1956

## 2020-04-21 ENCOUNTER — Encounter: Payer: Self-pay | Admitting: Physical Therapy

## 2020-04-21 ENCOUNTER — Other Ambulatory Visit: Payer: Self-pay

## 2020-04-21 ENCOUNTER — Ambulatory Visit: Payer: Medicare HMO | Admitting: Physical Therapy

## 2020-04-21 DIAGNOSIS — M6281 Muscle weakness (generalized): Secondary | ICD-10-CM | POA: Diagnosis not present

## 2020-04-21 DIAGNOSIS — M25512 Pain in left shoulder: Secondary | ICD-10-CM | POA: Diagnosis not present

## 2020-04-21 DIAGNOSIS — G8929 Other chronic pain: Secondary | ICD-10-CM | POA: Diagnosis not present

## 2020-04-21 NOTE — Patient Instructions (Signed)
Access Code: [email or web address] URL: [email or web address] Date: 04/21/2020 Prepared by: Hessie Diener  Exercises Clasped hands chest press and pullover - 1 x daily - 7 x weekly - 3 sets - 10 reps - 5 hold Shoulder External Rotation with Anchored Resistance - 2 x daily - 7 x weekly - 2 sets - 10 reps Standing Shoulder Internal Rotation with Anchored Resistance - 2 x daily - 7 x weekly - 2 sets - 10 reps

## 2020-04-21 NOTE — Therapy (Signed)
[place], [place], [address] Phone: [phone]   Fax:  [phone]  Physical Therapy Treatment  Patient Details  Name: [name] MRN: [id number] Date of Birth: 05/03/1956 Referring Provider (PT): Rudean Hitt Date: 04/21/2020   PT End of Session - 04/21/20 1150    Visit Number 2    Number of Visits 12    Date for PT Re-Evaluation 05/27/20    Authorization Type [place]    PT Start Time 3810    PT Stop Time 1240    PT Time Calculation (min) 53 min           Past Medical History:  Diagnosis Date  . Arthritis    L knee  . Hyperlipidemia   . Hypertension   . Prediabetes   . Seizures ([place])   . TBI (traumatic brain injury) [facility])    Memory issues   . Vitamin D deficiency     Past Surgical History:  Procedure Laterality Date  . ABDOMINAL HYSTERECTOMY     BSO  . BREAST BIOPSY Left   . KNEE ARTHROSCOPY Right     There were no vitals filed for this visit.   Subjective Assessment - 04/21/20 1148    Subjective Achey with the rain today.    Currently in Pain? Yes    Pain Score 8     Pain Location Shoulder    Pain Orientation Left    Pain Descriptors / Indicators Aching    Pain Type Chronic pain    Aggravating Factors  showering-reaching across    Pain Relieving Factors rest              [facility] PT Assessment - 04/21/20 0001      PROM   PROM Assessment Site Shoulder    Right/Left Shoulder Left    Left Shoulder Flexion 95 Degrees   pain   Left Shoulder ABduction 80 Degrees   pain   Left Shoulder Internal Rotation 62 Degrees    Left Shoulder External Rotation 64 Degrees                         [facility] Adult PT Treatment/Exercise - 04/21/20 0001      Shoulder Exercises: Supine   Other Supine Exercises clasped hand chest press and pullovers       Shoulder Exercises: Standing   External Rotation 10 reps    Theraband Level (Shoulder  External Rotation) Level 1 (Yellow)    Internal Rotation 10 reps    Theraband Level (Shoulder Internal Rotation) Level 1 (Yellow)    Other Standing Exercises standing cane flexion and abduction- increased pain.     Other Standing Exercises pendulums per HEP, scap retract       Manual Therapy   Manual therapy comments PROM Flexion, abduction , IR,ER to tolerance                   PT Education - 04/21/20 1228    Education Details HEP    Person(s) Educated Patient    Methods Explanation;Handout    Comprehension Verbalized understanding            PT Short Term Goals - 04/15/20 1019      PT SHORT TERM GOAL #1   Title Pt will be I with HEP    Baseline issued at eval    Time 3    Period Weeks  Status New    Target Date 05/06/20      PT SHORT TERM GOAL #2   Title Pt will demo improved AROM shoulder flexion L seated to 100 degrees to assist with reaching    Baseline 75 degrees    Time 3    Period Weeks    Status New    Target Date 05/06/20      PT SHORT TERM GOAL #3   Title Pt will demo improved L shoulder abduction to 80 degrees to assist with dressing    Baseline 54 degrees    Time 3    Period Weeks    Status New    Target Date 05/06/20             PT Long Term Goals - 04/15/20 1021      PT LONG TERM GOAL #1   Title Pt will demo improved strength of L shoulder flexion/abduction to 3+/5 to assist with household chores    Baseline 2-/5 flex/abdct    Time 6    Period Weeks    Status New    Target Date 05/27/20      PT LONG TERM GOAL #2   Title Pt will be able to brush her teeth with her L hand without difficulty    Baseline unable    Time 6    Period Weeks    Status New    Target Date 05/27/20      PT LONG TERM GOAL #3   Title Pt will be able to perform sweeping/vacuuming with </= 4/10 L shoulder    Baseline unable to perform at all    Time 6    Period Weeks    Status New    Target Date 05/27/20      PT LONG TERM GOAL #4   Title Pt will  be able to comb hair with 75% less difficulty    Baseline unable    Time 6    Period Weeks    Status New    Target Date 05/27/20      PT LONG TERM GOAL #5   Title Pt will have improved foto score to 31% limited    Baseline 60%    Time 6    Period Weeks    Status New    Target Date 05/27/20                 Plan - 04/21/20 1229    Clinical Impression Statement Pt arrives in 8/10 shoulder pain she reports was aggravated after taking a shower today. She has increased pain with reaching. Reviewed HEP and was able to progress with more AAROM and yellow band RTC strength. She was unable to tolerate supine cane exercises but did welll using clasped hands for chest press and pullovers. Updated HEP and applied moist heat at end of session for pain relief.    PT Next Visit Plan Review HEP, wand exercises; monitor for pain; work on ROM L shoulder    PT Home Exercise Plan Access code: [email or web address]; stressed to patient that she is to experience no pain with HEP. pendulums laterally, flex/ext, circular both directions; scap retract and supine L ER with stick all 2x/day 10 reps 1 set. Added access code [email or web address]: clasped hand chest press and pullover, yellow band IR/ER standing           Patient will benefit from skilled therapeutic intervention in order to improve the following deficits and impairments:  Decreased endurance, Decreased mobility,  Hypomobility, Decreased range of motion, Decreased activity tolerance, Decreased strength, Impaired flexibility, Impaired UE functional use, Postural dysfunction  Visit Diagnosis: Chronic left shoulder pain  Muscle weakness (generalized)     Problem List Patient Active Problem List   Diagnosis Date Noted  . Encounter for Medicare annual wellness exam 04/30/2016  . Prediabetes 03/11/2014  . Hyperlipidemia   . Hypertension   . Vitamin D deficiency   . TBI (traumatic brain injury) [facility])     Dorene Ar, PTA 04/21/2020, 12:48 PM  [facility] [address] [place], [place], [address] Phone: [phone]   Fax:  [phone]  Name: Mardell Suttles MRN: [id number] Date of Birth: [birth year]

## 2020-04-28 ENCOUNTER — Other Ambulatory Visit: Payer: Self-pay

## 2020-04-28 ENCOUNTER — Ambulatory Visit: Payer: Medicare HMO | Admitting: Physical Therapy

## 2020-04-28 DIAGNOSIS — M6281 Muscle weakness (generalized): Secondary | ICD-10-CM

## 2020-04-28 DIAGNOSIS — G8929 Other chronic pain: Secondary | ICD-10-CM

## 2020-04-28 DIAGNOSIS — M25512 Pain in left shoulder: Secondary | ICD-10-CM | POA: Diagnosis not present

## 2020-04-28 NOTE — Patient Instructions (Signed)
Access Code: [email or web address] URL: [email or web address] Date: 04/28/2020 Prepared by: Hessie Diener  Exercises Standing Shoulder Abduction AAROM with [place] - 2 x daily - 7 x weekly - 2 sets - 10 reps Standing shoulder flexion AAROM with dowel - 2 x daily - 7 x weekly - 2 sets - 10 reps Supine Shoulder Flexion Extension AAROM with Dowel - 2 x daily - 7 x weekly - 2 sets - 10 reps Standing Isometric Shoulder Abduction with Doorway - Arm Bent - 2 x daily - 7 x weekly - 2 sets - 10 reps - 5 hold

## 2020-04-28 NOTE — Therapy (Signed)
[place], [place], [address] Phone: [phone]   Fax:  [phone]  Physical Therapy Treatment  Patient Details  Name: [name] MRN: [id number] Date of Birth: 06/28/1956 Referring Provider (PT): Rudean Hitt Date: 04/28/2020   PT End of Session - 04/28/20 1106    Visit Number 3    Number of Visits 12    Date for PT Re-Evaluation 05/27/20    Authorization Type [place]    PT Start Time 1100    PT Stop Time 1143    PT Time Calculation (min) 43 min           Past Medical History:  Diagnosis Date  . Arthritis    L knee  . Hyperlipidemia   . Hypertension   . Prediabetes   . Seizures ([name])   . TBI (traumatic brain injury) [facility])    Memory issues   . Vitamin D deficiency     Past Surgical History:  Procedure Laterality Date  . ABDOMINAL HYSTERECTOMY     BSO  . BREAST BIOPSY Left   . KNEE ARTHROSCOPY Right     There were no vitals filed for this visit.   Subjective Assessment - 04/28/20 1104    Subjective Aches constantly with increased pain trying to shower or reaching for something.    Currently in Pain? Yes    Pain Score 9     Pain Location Shoulder    Pain Orientation Left    Pain Descriptors / Indicators Aching;Sharp    Pain Type Chronic pain    Aggravating Factors  showering, reaching    Pain Relieving Factors rest              [facility] PT Assessment - 04/28/20 0001      AROM   Left Shoulder Flexion 105 Degrees    Left Shoulder ABduction 60 Degrees                         [facility] Adult PT Treatment/Exercise - 04/28/20 0001      Shoulder Exercises: Supine   Flexion Limitations bent elbow -punch x 10 -AROM    Other Supine Exercises cane pullovers 90 and up x 10 , horizontal abduction/adduction  x 10       Shoulder Exercises: Standing   External Rotation 10 reps   2 sets    Theraband Level (Shoulder External Rotation) Level 1  (Yellow)    Internal Rotation 10 reps   2 sets   Theraband Level (Shoulder Internal Rotation) Level 1 (Yellow)    Row 20 reps    Theraband Level (Shoulder Row) Level 1 (Yellow)    Other Standing Exercises standing cane flexion and scaption- tolerated better today     Other Standing Exercises abduction isometrics at wall with elbow bent and straight 10 each       Manual Therapy   Manual therapy comments PROM Flexion, abduction , IR,ER to tolerance                   PT Education - 04/28/20 1143    Education Details HEP    Person(s) Educated Patient    Methods Explanation;Handout    Comprehension Verbalized understanding            PT Short Term Goals - 04/15/20 1019      PT SHORT TERM GOAL #1   Title Pt will be I with  HEP    Baseline issued at eval    Time 3    Period Weeks    Status New    Target Date 05/06/20      PT SHORT TERM GOAL #2   Title Pt will demo improved AROM shoulder flexion L seated to 100 degrees to assist with reaching    Baseline 75 degrees    Time 3    Period Weeks    Status New    Target Date 05/06/20      PT SHORT TERM GOAL #3   Title Pt will demo improved L shoulder abduction to 80 degrees to assist with dressing    Baseline 54 degrees    Time 3    Period Weeks    Status New    Target Date 05/06/20             PT Long Term Goals - 04/15/20 1021      PT LONG TERM GOAL #1   Title Pt will demo improved strength of L shoulder flexion/abduction to 3+/5 to assist with household chores    Baseline 2-/5 flex/abdct    Time 6    Period Weeks    Status New    Target Date 05/27/20      PT LONG TERM GOAL #2   Title Pt will be able to brush her teeth with her L hand without difficulty    Baseline unable    Time 6    Period Weeks    Status New    Target Date 05/27/20      PT LONG TERM GOAL #3   Title Pt will be able to perform sweeping/vacuuming with </= 4/10 L shoulder    Baseline unable to perform at all    Time 6    Period  Weeks    Status New    Target Date 05/27/20      PT LONG TERM GOAL #4   Title Pt will be able to comb hair with 75% less difficulty    Baseline unable    Time 6    Period Weeks    Status New    Target Date 05/27/20      PT LONG TERM GOAL #5   Title Pt will have improved foto score to 31% limited    Baseline 60%    Time 6    Period Weeks    Status New    Target Date 05/27/20                 Plan - 04/28/20 1106    Clinical Impression Statement Pt reports she aggravated the shoulder with showering. Time spent wth activity modificatons for the shower to avoid sharp increased in pain. Her AROM has improved to 105 degrees flexion.  Abduction 60 degrees, STanding cane AAROM well tolerated today and able to add to HEP.    PT Next Visit Plan Review HEP, wand exercises; monitor for pain; work on ROM L shoulder, continue yellow band strength-review abduction isometrics    PT Home Exercise Plan Access code: [email or web address]; stressed to patient that she is to experience no pain with HEP. pendulums laterally, flex/ext, circular both directions; scap retract and supine L ER with stick all 2x/day 10 reps 1 set. Added access code [email or web address]: clasped hand chest press and pullover, yellow band IR/ER standing           Patient will benefit from skilled therapeutic intervention in order to improve the following deficits and impairments:  Decreased endurance, Decreased mobility, Hypomobility, Decreased range of motion, Decreased activity tolerance, Decreased strength, Impaired flexibility, Impaired UE functional use, Postural dysfunction  Visit Diagnosis: Chronic left shoulder pain  Muscle weakness (generalized)     Problem List Patient Active Problem List   Diagnosis Date Noted  . Encounter for Medicare annual wellness exam 04/30/2016  . Prediabetes 03/11/2014  . Hyperlipidemia   . Hypertension   . Vitamin D deficiency   . TBI (traumatic brain injury) [facility])     Dorene Ar,  PTA 04/28/2020, 12:20 PM  [facility] [address] [place], [place], [address] Phone: [phone]   Fax:  [phone]  Name: [name] MRN: [id number] Date of Birth: 12-17-1955

## 2020-05-05 ENCOUNTER — Ambulatory Visit: Payer: Medicare HMO | Admitting: Rehabilitative and Restorative Service Providers"

## 2020-05-05 ENCOUNTER — Other Ambulatory Visit: Payer: Self-pay

## 2020-05-05 ENCOUNTER — Ambulatory Visit: Payer: Medicare HMO | Admitting: Family Medicine

## 2020-05-05 DIAGNOSIS — M25512 Pain in left shoulder: Secondary | ICD-10-CM

## 2020-05-05 DIAGNOSIS — M6281 Muscle weakness (generalized): Secondary | ICD-10-CM

## 2020-05-05 DIAGNOSIS — G8929 Other chronic pain: Secondary | ICD-10-CM | POA: Diagnosis not present

## 2020-05-05 NOTE — Therapy (Addendum)
[name], [place], [address] Phone: [phone]   Fax:  [phone]  Physical Therapy Treatment/Discharge  Patient Details  Name: [name] MRN: [id number] Date of Birth: April 08, 1956 Referring Provider (PT): Rudean Hitt Date: 05/05/2020   PT End of Session - 05/05/20 1509    Visit Number 4    Number of Visits 12    Date for PT Re-Evaluation 05/27/20    Authorization Type [employer]    PT Start Time 0303    PT Stop Time 9323    PT Time Calculation (min) 41 min    Activity Tolerance Patient tolerated treatment well;No increased pain    Behavior During Therapy WFL for tasks assessed/performed           Past Medical History:  Diagnosis Date  . Arthritis    L knee  . Hyperlipidemia   . Hypertension   . Prediabetes   . Seizures ([place])   . TBI (traumatic brain injury) [facility])    Memory issues   . Vitamin D deficiency     Past Surgical History:  Procedure Laterality Date  . ABDOMINAL HYSTERECTOMY     BSO  . BREAST BIOPSY Left   . KNEE ARTHROSCOPY Right     There were no vitals filed for this visit.   Subjective Assessment - 05/05/20 1505    Subjective the arm is still there. i am able to do more but it still hurts.    Currently in Pain? Yes    Pain Score 4     Pain Location Shoulder    Pain Orientation Left    Pain Descriptors / Indicators Aching    Pain Type Chronic pain    Pain Onset 1 to 4 weeks ago    Pain Frequency Intermittent    Multiple Pain Sites No                             [facility] Adult PT Treatment/Exercise - 05/05/20 0001      Shoulder Exercises: Supine   Other Supine Exercises clasped hand shoulder flex/ext AAROM x 10 with varying angles of shoulder flexion x 10 bouts each; elbows bent on the bed doing forearm pronation/supination x 10; cane pullovers x 15; cane chest press x 20; L shoulder ext iso with elbow bent x 20 with  3-5 sec hold without pain; yellow band chest pull x 20; yellow band bil ER x 20; clasped hands shoulder flex/ext x 10; attempted yellow band limited ROM hip pull x 2 reps but pt uncomfortable and therefore terminated.       Shoulder Exercises: Seated   Other Seated Exercises scap retract bil x 20; L scap retract x 20 with PT vc's and tactile cues for technique; iso scap retract with shoulder flex/ext approx 45 degrees bil x 20; shoulder rolls 10 anterior and 20 posterior      Shoulder Exercises: Standing   External Rotation 20 reps    Theraband Level (Shoulder External Rotation) Level 1 (Yellow)    Internal Rotation 20 reps    Theraband Level (Shoulder Internal Rotation) Level 1 (Yellow)    Theraband Level (Shoulder Extension) Level 1 (Yellow)    Extension Weight (lbs) 20 reps    Row 20 reps    Theraband Level (Shoulder Row) Level 1 (Yellow)    Other Standing Exercises wall ladder x 5 reps with highest level at #22;  wall shoulder isometrics flexion x 20 with 5 sec hold; shoulder row ext iso into wall x 20 with 3-5 sec hold                    PT Short Term Goals - 05/05/20 1513      PT SHORT TERM GOAL #1   Title Pt will be I with HEP    Time 3    Period Weeks    Status On-going      PT SHORT TERM GOAL #2   Title Pt will demo improved AROM shoulder flexion L seated to 100 degrees to assist with reaching    Time 3    Period Weeks    Status On-going      PT SHORT TERM GOAL #3   Title Pt will demo improved L shoulder abduction to 80 degrees to assist with dressing    Time 3    Period Weeks    Status On-going             PT Long Term Goals - 04/15/20 1021      PT LONG TERM GOAL #1   Title Pt will demo improved strength of L shoulder flexion/abduction to 3+/5 to assist with household chores    Baseline 2-/5 flex/abdct    Time 6    Period Weeks    Status New    Target Date 05/27/20      PT LONG TERM GOAL #2   Title Pt will be able to brush her teeth with her L  hand without difficulty    Baseline unable    Time 6    Period Weeks    Status New    Target Date 05/27/20      PT LONG TERM GOAL #3   Title Pt will be able to perform sweeping/vacuuming with </= 4/10 L shoulder    Baseline unable to perform at all    Time 6    Period Weeks    Status New    Target Date 05/27/20      PT LONG TERM GOAL #4   Title Pt will be able to comb hair with 75% less difficulty    Baseline unable    Time 6    Period Weeks    Status New    Target Date 05/27/20      PT LONG TERM GOAL #5   Title Pt will have improved foto score to 31% limited    Baseline 60%    Time 6    Period Weeks    Status New    Target Date 05/27/20                 Plan - 05/05/20 1509    Clinical Impression Statement Pt has demonstrated improvement in shoulder range of motion from evaluation with increasing pain with increasing motion. Pt would continue to benefit from PT for further L shoulder range of motion per MD instructions and gentle L shoulder strengthening due to L partial RCT. See eval. Pt returns to MD 7/29.    Rehab Potential Fair    PT Frequency 2x / week    PT Duration 6 weeks    PT Treatment/Interventions Cryotherapy;Ultrasound;Moist Heat;Iontophoresis '4mg'$ /ml Dexamethasone;Electrical Stimulation;Functional mobility training;Therapeutic exercise;Therapeutic activities;Patient/family education;Manual techniques;Passive range of motion;Dry needling    PT Next Visit Plan continue wand exercises; monitor for pain; work on ROM L shoulder, continue yellow band strength-review abduction isometrics    Consulted and Agree with Plan  of Care Patient           Patient will benefit from skilled therapeutic intervention in order to improve the following deficits and impairments:  Decreased endurance, Decreased mobility, Hypomobility, Decreased range of motion, Decreased activity tolerance, Decreased strength, Impaired flexibility, Impaired UE functional use, Postural  dysfunction  Visit Diagnosis: Chronic left shoulder pain  Muscle weakness (generalized)     Problem List Patient Active Problem List   Diagnosis Date Noted  . Encounter for Medicare annual wellness exam 04/30/2016  . Prediabetes 03/11/2014  . Hyperlipidemia   . Hypertension   . Vitamin D deficiency   . TBI (traumatic brain injury) [facility])     [name], PT 05/05/2020, 3:45 PM  [facility] [address] [place], [place], [address] Phone: [phone]   Fax:  [phone]  Name: Jolissa Kapral MRN: [id number] Date of Birth: 1956/02/14 PHYSICAL THERAPY DISCHARGE SUMMARY  Visits from Start of Care: 4  Current functional level related to goals / functional outcomes: See abvoe   Remaining deficits: See above   Education / Equipment: Anatomy of condition,  POC, HEP, exercise form/rationale  Plan: Patient agrees to discharge.  Patient goals were not met. Patient is being discharged due to the patient's request.  ?????  Returning to MD for imaging.    Jessica C. Hightower PT, DPT 06/16/20 2:41 PM

## 2020-05-12 ENCOUNTER — Ambulatory Visit (INDEPENDENT_AMBULATORY_CARE_PROVIDER_SITE_OTHER): Payer: Medicare HMO | Admitting: Sports Medicine

## 2020-05-12 ENCOUNTER — Other Ambulatory Visit: Payer: Self-pay

## 2020-05-12 VITALS — BP 124/68 | Ht 61.0 in | Wt 124.0 lb

## 2020-05-12 DIAGNOSIS — M25512 Pain in left shoulder: Secondary | ICD-10-CM

## 2020-05-12 NOTE — Progress Notes (Signed)
   Subjective:    Patient ID: [name], female    DOB: December 01, 1955, 64 y.o.   MRN: [id number]  HPI   Patient presents today for follow-up on left shoulder pain.  She was last seen in the office on June 17 and diagnosed with a high-grade partial supraspinatus tear.  This tear was seen on ultrasound.  There was also suspicion of adhesive capsulitis.  She was referred to physical therapy.  She has had a total of 4 visits with minimal improvement.  She continues to endorse lateral shoulder pain with radiating pain down to the elbow.  It is worse at night.  She also endorses limited range of motion, especially with overhead lifting.  She is also experiencing weakness.  X-rays of her left shoulder done previously were unremarkable.  She is left-hand dominant    Review of Systems    As above Objective:   Physical Exam Well-developed, well-nourished.  No acute distress  Left shoulder: No obvious deformity.  No tenderness to palpation.  Patient has limited active range of motion in forward flexion, abduction, and internal rotation.  Passive forward flexion and abduction is much better.  There is some limited passive external rotation on the left compared to the right and this does slightly reproduce pain.  4/5 strength with resisted supraspinatus on the left compared to 5/5 on the right.  Well preserved strength against resisted external rotation and internal rotation on the left.  Neurovascular intact distally.       Assessment & Plan:   Persistent left shoulder pain secondary to rotator cuff tear  Patient has profoundly limited range of motion as well as weakness with supraspinatus testing.  She is not improving with conservative treatment.  I recommended that we get an MRI of the left shoulder specifically to better evaluate the degree of rotator cuff tearing present.  Although she may have a mild amount of adhesive capsulitis, I think that her main issue is her rotator cuff.  Phone  follow-up with those results when available.  We will delineate further treatment based on those findings.  In the meantime, I have asked that she put formal physical therapy on hold but continue with her home exercises if not too painful.

## 2020-05-13 ENCOUNTER — Ambulatory Visit: Payer: Medicare HMO | Admitting: Rehabilitative and Restorative Service Providers"

## 2020-05-16 ENCOUNTER — Telehealth: Payer: Self-pay | Admitting: Family Medicine

## 2020-05-16 NOTE — Telephone Encounter (Signed)
Pt dropped Medical Certification for application of Disability parking placard. I have placed this form at nurse's station in [employer] box. Please advise when ready [phone].

## 2020-05-17 ENCOUNTER — Telehealth: Payer: Self-pay

## 2020-05-17 NOTE — Telephone Encounter (Signed)
Called, lm for pick up of handicapped placard form. Form will be put in box in front office

## 2020-05-18 NOTE — Telephone Encounter (Signed)
FYI

## 2020-05-19 NOTE — Telephone Encounter (Signed)
Form completed and signed on 05/17/2020

## 2020-05-26 ENCOUNTER — Encounter: Payer: BC Managed Care – PPO | Admitting: Physical Therapy

## 2020-06-04 ENCOUNTER — Ambulatory Visit
Admission: RE | Admit: 2020-06-04 | Discharge: 2020-06-04 | Disposition: A | Discharge status: Home / Self Care | Payer: Medicare HMO | Source: Ambulatory Visit | Attending: Sports Medicine | Admitting: Sports Medicine

## 2020-06-04 ENCOUNTER — Other Ambulatory Visit: Payer: Self-pay

## 2020-06-04 DIAGNOSIS — D179 Benign lipomatous neoplasm, unspecified: Secondary | ICD-10-CM | POA: Diagnosis not present

## 2020-06-04 DIAGNOSIS — M25512 Pain in left shoulder: Secondary | ICD-10-CM

## 2020-06-04 DIAGNOSIS — M19012 Primary osteoarthritis, left shoulder: Secondary | ICD-10-CM | POA: Diagnosis not present

## 2020-06-04 DIAGNOSIS — M75122 Complete rotator cuff tear or rupture of left shoulder, not specified as traumatic: Secondary | ICD-10-CM | POA: Diagnosis not present

## 2020-06-06 ENCOUNTER — Encounter: Payer: BC Managed Care – PPO | Admitting: Physical Therapy

## 2020-06-06 ENCOUNTER — Other Ambulatory Visit: Payer: BC Managed Care – PPO

## 2020-06-06 ENCOUNTER — Telehealth: Payer: Self-pay | Admitting: Sports Medicine

## 2020-06-06 NOTE — Telephone Encounter (Signed)
  I spoke with [name] on the phone today after reviewing MRI findings of her left shoulder.  She has significant rotator cuff tendinopathy with a 1.5 cm full-thickness rotator cuff tear as well as severe tendinopathy of the biceps tendon.  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  Based on these findings I recommend surgical consultation with Dr. Griffin Basil.  I will defer further work-up and treatment to his discretion.  Patient is in agreement with this plan.

## 2020-06-09 DIAGNOSIS — M25512 Pain in left shoulder: Secondary | ICD-10-CM | POA: Diagnosis not present

## 2020-06-14 ENCOUNTER — Encounter: Payer: BC Managed Care – PPO | Admitting: Physical Therapy

## 2020-06-24 ENCOUNTER — Other Ambulatory Visit: Payer: Self-pay

## 2020-06-24 ENCOUNTER — Encounter: Payer: Self-pay | Admitting: Family Medicine

## 2020-06-24 ENCOUNTER — Ambulatory Visit (INDEPENDENT_AMBULATORY_CARE_PROVIDER_SITE_OTHER): Payer: Medicare HMO | Admitting: Family Medicine

## 2020-06-24 VITALS — BP 140/85 | HR 80 | Temp 97.9°F | Ht 61.0 in | Wt 133.2 lb

## 2020-06-24 DIAGNOSIS — M75122 Complete rotator cuff tear or rupture of left shoulder, not specified as traumatic: Secondary | ICD-10-CM

## 2020-06-24 DIAGNOSIS — Z23 Encounter for immunization: Secondary | ICD-10-CM

## 2020-06-24 NOTE — Patient Instructions (Signed)
° ° ° °  If you have lab work done today you will be contacted with your lab results within the next 2 weeks.  If you have not heard from us then please contact us. The fastest way to get your results is to register for My Chart. ° ° °IF you received an x-ray today, you will receive an invoice from [facility]. Please contact [facility] at [phone] with questions or concerns regarding your invoice.  ° °IF you received labwork today, you will receive an invoice from [facility]. Please contact [facility] at [phone] with questions or concerns regarding your invoice.  ° °Our billing staff will not be able to assist you with questions regarding bills from these companies. ° °You will be contacted with the lab results as soon as they are available. The fastest way to get your results is to activate your My Chart account. Instructions are located on the last page of this paperwork. If you have not heard from us regarding the results in 2 weeks, please contact this office. °  ° ° ° °

## 2020-06-24 NOTE — Progress Notes (Signed)
9/10/202111:44 AM  [name] May 30, 1956, 64 y.o., female [id number]  Chief Complaint  Patient presents with  . Pain    pain in the left shoulder, wants second opinion on the surgery for rotator cuff. Does not want the surgery    HPI:   Patient is a 64 y.o. female with past medical history significant for  HTN, HLP, prediabetes, OA  Knees, TBI, vitamin D and B12 deficiences who presents today to discuss recommendation for rotator cuff surgery  Patient with left shoulder pain that was not improving with PT Last ortho visit May 12 2020 - Dr Micheline Chapman, MRI to confirm supraspinatus tear seen in [place], normal xray, based on MRI results her referred her to Dr Griffin Basil for surgical consultation  MRI:  Marked appearing rotator cuff tendinopathy with a 1.5 cm from front to back full-thickness tear of the anterior and far lateral supraspinatus. Retraction is 1-2 cm. No atrophy.  Severe appearing tendinopathy of the intra and extra-articular long head of biceps. There appears to be split tearing of the tendon in the superior aspect of the bicipital groove.  Moderate acromioclavicular osteoarthritis. Type 2 acromion with some subacromial spurring also noted.  Subacromial/subdeltoid fluid compatible with bursitis.  Lipomatous tumor deep to the subscapularis is incompletely visualized but the bulk of the lesion is seen and has an appearance consistent with a simple lipoma. A second much smaller simple lipoma anterior to the neck of the glenoid is noted.  Patient has been doing HEP Having better movement of shoulder, able to brush her teeth more comfortably now but still has pain  Depression screen [facility] 2/9 02/04/2020 04/30/2016 10/18/2015  Decreased Interest 0 0 0  Down, Depressed, Hopeless 0 0 0  PHQ - 2 Score 0 0 0    Fall Risk  02/04/2020 04/30/2016 10/18/2015  Falls in the past year? 0 No Yes  Number falls in past yr: 0 - 1  Injury with Fall? 0 - Yes  Risk Factor Category  -  - High Fall Risk  Risk for fall due to : - - History of fall(s)  Follow up Falls evaluation completed - Falls evaluation completed;Education provided;Follow up appointment;Falls prevention discussed     No Known Allergies  Prior to Admission medications   Medication Sig Start Date End Date Taking? Authorizing Provider  ACCU-CHEK AVIVA PLUS test strip  12/18/19  Yes [provider]  Accu-Chek Softclix Lancets lancets  12/18/19  Yes [provider]  Alcohol Swabs (B-D SINGLE USE SWABS REGULAR) PADS  12/18/19  Yes [provider]  Blood Glucose Monitoring Suppl (ACCU-CHEK AVIVA PLUS) w/Device KIT  12/18/19  Yes [provider]  Cholecalciferol (VITAMIN D PO) Take 5,000 Int'l Units by mouth daily.   Yes [provider]  Cyanocobalamin (B-12 PO) Take 2,500 mcg by mouth daily.   Yes [provider]  diclofenac sodium (VOLTAREN) 1 % GEL Apply 4 g topically 4 (four) times daily as needed. 12/03/18  Yes Hilts, Legrand Como, MD  irbesartan-hydrochlorothiazide (AVALIDE) 300-12.5 MG tablet  12/02/18  Yes [provider]  meloxicam (MOBIC) 7.5 MG tablet Take 1 tablet (7.5 mg total) by mouth daily. 03/27/20  Yes Orvan July, NP    Past Medical History:  Diagnosis Date  . Arthritis    L knee  . Hyperlipidemia   . Hypertension   . Prediabetes   . Seizures ([place])   . TBI (traumatic brain injury) [facility])    Memory issues   . Vitamin D deficiency  Past Surgical History:  Procedure Laterality Date  . ABDOMINAL HYSTERECTOMY     BSO  . BREAST BIOPSY Left   . KNEE ARTHROSCOPY Right     Social History   Tobacco Use  . Smoking status: Never Smoker  . Smokeless tobacco: Never Used  Substance Use Topics  . Alcohol use: No    Family History  Problem Relation Age of Onset  . Hypertension Mother   . Diabetes Mother   . Heart disease Father   . Cancer Brother   . Kidney disease Other     ROS Per hpi  OBJECTIVE:  Today's Vitals    06/24/20 1126  BP: 140/85  Pulse: 80  Temp: 97.9 F (36.6 C)  SpO2: 100%  Weight: 133 lb 3.2 oz (60.4 kg)  Height: _0  (1.549 m)   Body mass index is 25.17 kg/m.   Physical Exam Vitals and nursing note reviewed.  Constitutional:      Appearance: She is well-developed.  HENT:     Head: Normocephalic and atraumatic.  Eyes:     General: No scleral icterus.    Conjunctiva/sclera: Conjunctivae normal.     Pupils: Pupils are equal, round, and reactive to light.  Pulmonary:     Effort: Pulmonary effort is normal.  Musculoskeletal:     Cervical back: Neck supple.  Skin:    General: Skin is warm and dry.  Neurological:     Mental Status: She is alert and oriented to person, place, and time.     No results found for this or any previous visit (from the past 24 hour(s)).  No results found.   ASSESSMENT and PLAN  1. Nontraumatic complete tear of left rotator cuff Discussed with patient that I am not at orthopedist and therefore unable to provide 2nd opinion. Discussed importance of preserving function. Advise discussing again with ortho or seeking another ortho for a true 2nd opinion.    2. Need for prophylactic vaccination and inoculation against influenza - Flu Vaccine QUAD 36+ mos IM  No follow-ups on file.    Rutherford Guys, MD Primary Care at [place], [place] [address] Ph.  [phone] Fax [phone]

## 2020-07-05 ENCOUNTER — Ambulatory Visit (INDEPENDENT_AMBULATORY_CARE_PROVIDER_SITE_OTHER): Payer: Medicare HMO | Admitting: Sports Medicine

## 2020-07-05 ENCOUNTER — Other Ambulatory Visit: Payer: Self-pay

## 2020-07-05 VITALS — BP 138/84 | Ht 61.0 in | Wt 133.0 lb

## 2020-07-05 DIAGNOSIS — M75102 Unspecified rotator cuff tear or rupture of left shoulder, not specified as traumatic: Secondary | ICD-10-CM

## 2020-07-06 NOTE — Progress Notes (Signed)
   Subjective:    Patient ID: [name], female    DOB: 20-Dec-1955, 64 y.o.   MRN: [id number]  HPI   Patient comes in today to discuss a rotator cuff tear in her left shoulder.  Tear was identified on an MRI on August 21.  It is a full-thickness tear that is 1.5 cm from front to back.  Retraction of 1 to 2 cm.  No atrophy.  She was referred to Dr. Griffin Basil and he recommended surgery but the patient is hesitant to proceed.  She states that her left shoulder pain has improved quite a bit.  She is limiting what she is doing as far as heavy pushing and pulling and that seems to help.  She is also doing her home exercises.  At this point in time her function is fairly well-preserved and her pain is not severe enough that she feels like surgery is warranted.    Review of Systems    As above Objective:   Physical Exam  Well-developed, well-nourished.  No acute distress.  Vital signs reviewed  Left shoulder: Patient has active forward flexion to about 160 degrees.  Active abduction is 130 degrees.  She has full internal rotation and full external rotation.  She does have some pain with empty can, and [place].  Her rotator cuff strength is 4+/5 with resisted supraspinatus and 5/5 with resisted external rotation.  She is neurovascularly intact distally.      Assessment & Plan:   Left shoulder pain secondary to full-thickness rotator cuff tear  I had a long talk with the patient today regarding her MRI findings.  Although her MRI does show a full-thickness rotator cuff tear, her function is not affected enough that she is willing to risk surgery.  Her pain is improving with conservative treatment so we will continue with that for now.  She does understand that there is a chance that the tear could worsen and become irreparable at a later date.  If she changes her mind about surgery, she will return to Dr. Griffin Basil.  Otherwise, follow-up with me as needed.

## 2020-08-01 DIAGNOSIS — M1712 Unilateral primary osteoarthritis, left knee: Secondary | ICD-10-CM | POA: Diagnosis not present

## 2020-08-05 ENCOUNTER — Ambulatory Visit: Payer: Medicare HMO | Admitting: Family Medicine

## 2020-08-17 DIAGNOSIS — M170 Bilateral primary osteoarthritis of knee: Secondary | ICD-10-CM | POA: Diagnosis not present

## 2020-08-24 DIAGNOSIS — M170 Bilateral primary osteoarthritis of knee: Secondary | ICD-10-CM | POA: Diagnosis not present

## 2020-08-30 ENCOUNTER — Ambulatory Visit (INDEPENDENT_AMBULATORY_CARE_PROVIDER_SITE_OTHER): Payer: Medicare HMO

## 2020-08-30 ENCOUNTER — Encounter (HOSPITAL_COMMUNITY): Payer: Self-pay

## 2020-08-30 ENCOUNTER — Ambulatory Visit (HOSPITAL_COMMUNITY)
Admission: EM | Admit: 2020-08-30 | Discharge: 2020-08-30 | Disposition: A | Discharge status: Home / Self Care | Payer: Medicare HMO | Attending: Family Medicine | Admitting: Family Medicine

## 2020-08-30 ENCOUNTER — Other Ambulatory Visit: Payer: Self-pay

## 2020-08-30 DIAGNOSIS — R079 Chest pain, unspecified: Secondary | ICD-10-CM | POA: Diagnosis not present

## 2020-08-30 DIAGNOSIS — R42 Dizziness and giddiness: Secondary | ICD-10-CM

## 2020-08-30 DIAGNOSIS — I10 Essential (primary) hypertension: Secondary | ICD-10-CM | POA: Diagnosis not present

## 2020-08-30 DIAGNOSIS — H8110 Benign paroxysmal vertigo, unspecified ear: Secondary | ICD-10-CM | POA: Diagnosis not present

## 2020-08-30 LAB — BASIC METABOLIC PANEL
Anion gap: 9 (ref 5–15)
BUN: 11 mg/dL (ref 8–23)
CO2: 28 mmol/L (ref 22–32)
Calcium: 9.7 mg/dL (ref 8.9–10.3)
Chloride: 98 mmol/L (ref 98–111)
Creatinine, Ser: 0.68 mg/dL (ref 0.44–1.00)
GFR, Estimated: >60 mL/min (ref >60)
Glucose, Bld: 104 mg/dL — ABNORMAL HIGH (ref 70–99)
Potassium: 3.3 mmol/L — ABNORMAL LOW (ref 3.5–5.1)
Sodium: 135 mmol/L (ref 135–145)

## 2020-08-30 LAB — CBC
HCT: 39.8 % (ref 36.0–46.0)
Hemoglobin: 13.2 g/dL (ref 12.0–15.0)
MCH: 31.1 pg (ref 26.0–34.0)
MCHC: 33.2 g/dL (ref 30.0–36.0)
MCV: 93.9 fL (ref 80.0–100.0)
Platelets: 149 10*3/uL — ABNORMAL LOW (ref 150–400)
RBC: 4.24 MIL/uL (ref 3.87–5.11)
RDW: 11.7 % (ref 11.5–15.5)
WBC: 3.4 10*3/uL — ABNORMAL LOW (ref 4.0–10.5)
nRBC: 0 % (ref 0.0–0.2)

## 2020-08-30 MED ORDER — ONDANSETRON 4 MG PO TBDP
ORAL_TABLET | ORAL | Status: AC
  Filled 2020-08-30: qty 1

## 2020-08-30 MED ORDER — ONDANSETRON 4 MG PO TBDP: 4.0000 mg | ORAL_TABLET | Freq: Three times a day (TID) | ORAL | 0 refills | Status: DC | PRN

## 2020-08-30 MED ORDER — ONDANSETRON 4 MG PO TBDP
4.0000 mg | ORAL_TABLET | Freq: Once | ORAL | Status: AC
  Administered 2020-08-30: 4 mg via ORAL

## 2020-08-30 NOTE — Discharge Instructions (Addendum)
You have been seen at the [facility] today for vertigo and chest discomfort. Your evaluation today was not suggestive of any emergent condition requiring medical intervention at this time. Your chest x-ray and ECG (heart tracing) did not show any worrisome changes. However, some medical problems make take more time to appear. Therefore, it's very important that you pay attention to any new symptoms or worsening of your current condition.  Please proceed directly to the Emergency Department immediately should you feel worse in any way or have any of the following symptoms: increasing or different chest pain, pain that spreads to your arm, neck, jaw, back or abdomen, shortness of breath, or uncontrollable nausea and vomiting.

## 2020-08-30 NOTE — ED Triage Notes (Signed)
Pt c/o dizziness, "room spinning" onset last night while lying down, also reports central CP "tight" in nature, with nausea.  Is in wheelchair 2/2 dizziness Pt states she has been eating/drinking and urinating WNL w/o complications. States her blood glucose was 98 PTA.  Denies vomiting, diaphoresis, changes to vision or speech, SOB, ear fullness/pain, denies extremity weakness.    CP non reproducible with palpation. EKG performed and results to Dr. Mannie Stabile and in to triage for eval, explained that he can do initial eval here, but pt may have to go to ER for higher level care/eval.

## 2020-08-31 DIAGNOSIS — M170 Bilateral primary osteoarthritis of knee: Secondary | ICD-10-CM | POA: Diagnosis not present

## 2020-08-31 NOTE — ED Provider Notes (Addendum)
[place]   [id number] 08/30/20 Arrival Time: 6440  ASSESSMENT & PLAN:  1. Benign paroxysmal positional vertigo, unspecified laterality   2. Chest pain, unspecified type     I have personally viewed the imaging studies ordered this visit. Normal CXR.  ECG: Sinus rhythm; no STEMI.  Normal neurologic exam. No suspicion for ICH or SAH. No indication for neurodiagnostic imaging at this time. Discussed. See AVS for d/c information.  If needed: Meds ordered this encounter  Medications  . ondansetron (ZOFRAN-ODT) disintegrating tablet 4 mg  . ondansetron (ZOFRAN-ODT) 4 MG disintegrating tablet    Sig: Take 1 tablet (4 mg total) by mouth every 8 (eight) hours as needed for nausea or vomiting.    Dispense:  15 tablet    Refill:  0    Reassured that these symptoms do not appear to represent a serious or threatening condition. This is generally a self-limited temporary but uncomfortable situation. Rest, avoid potentially dangerous activities (such as driving or working with machinery or at heights). Use OTC Meclizine prn. Will proceed to the ED if he develops other symptoms such as alterations of speech, swallowing, vision, motor/sensory systems, or if dizziness worsens.  Reviewed expectations re: course of current medical issues. Questions answered. Outlined signs and symptoms indicating need for more acute intervention. Patient verbalized understanding. After Visit Summary given.   SUBJECTIVE:  [name] is a 64 y.o. female who reports abrupt onset of dizziness described as vertigo. Current symptoms first noted yest evening and have gradually improved. Aggravating factors: head movements and bending. Denies coordination problems, dizziness, gait problems, headaches, seizures, speech problems and weakness as well as aural pressure. Recent infections: none. Head trauma: denied. Noise exposure: none. Initially reports feeling chest pain but later describes this as  feeling nauseous. No emesis. No SOB. Recent travel: none. Reports normal bowel/bladder habits. Previous workup/treatments: none. H/O similar: no. Therapies tried thus far: none.  Social History   Substance and Sexual Activity  Alcohol Use No   Social History   Tobacco Use  Smoking Status Never Smoker  Smokeless Tobacco Never Used   Denies illegal drug use.    OBJECTIVE:  Vitals:   08/30/20 1719  BP: 133/65  Pulse: 81  Resp: 20  Temp: 98.2 F (36.8 C)  TempSrc: Oral  SpO2: 100%    General appearance: alert; no distress Eyes: PERRLA; EOMI; conjunctiva normal HENT: normocephalic; atraumatic; TMs normal; nasal mucosa normal; oral mucosa normal Neck: supple with FROM Lungs: clear to auscultation bilaterally Heart: regular rate and rhythm Abdomen: soft, non-tender; bowel sounds normal Extremities: no cyanosis or edema; symmetrical with no gross deformities Skin: warm and dry Neurologic: normal gait; DTR's normal and symmetric; CN 2-12 grossly intact; rapid changes in position during the exam do precipitate brief dizziness Psychological: alert and cooperative; normal mood and affect  Investigations: Results for orders placed or performed during the hospital encounter of 08/30/20  CBC  Result Value Ref Range   WBC 3.4 (L) 4.0 - 10.5 K/uL   RBC 4.24 3.87 - 5.11 MIL/uL   Hemoglobin 13.2 12.0 - 15.0 g/dL   HCT 39.8 36 - 46 %   MCV 93.9 80.0 - 100.0 fL   MCH 31.1 26.0 - 34.0 pg   MCHC 33.2 30.0 - 36.0 g/dL   RDW 11.7 11.5 - 15.5 %   Platelets 149 (L) 150 - 400 K/uL   nRBC 0.0 0.0 - 0.2 %  Basic metabolic panel  Result Value Ref Range   Sodium  135 135 - 145 mmol/L   Potassium 3.3 (L) 3.5 - 5.1 mmol/L   Chloride 98 98 - 111 mmol/L   CO2 28 22 - 32 mmol/L   Glucose, Bld 104 (H) 70 - 99 mg/dL   BUN 11 8 - 23 mg/dL   Creatinine, Ser 0.68 0.44 - 1.00 mg/dL   Calcium 9.7 8.9 - 10.3 mg/dL   GFR, Estimated >60 >60 mL/min   Anion gap 9 5 - 15    Imaging: DG Chest 2  View  Result Date: 08/30/2020 CLINICAL DATA:  Chest pain and dizziness for 1 day. Nonsmoker with high blood pressure. EXAM: CHEST - 2 VIEW COMPARISON:  None. FINDINGS: Mild osteopenia. Suspect vertebral body height loss at upper thoracic levels, suboptimally evaluated on the lateral view. Mild S shaped thoracic spine curvature. Midline trachea. Normal heart size and mediastinal contours. No pleural effusion or pneumothorax. Clear lungs. IMPRESSION: No acute cardiopulmonary disease. Electronically Signed   By: Abigail Miyamoto M.D.   On: 08/30/2020 18:13    No Known Allergies  Past Medical History:  Diagnosis Date  . Arthritis    L knee  . Hyperlipidemia   . Hypertension   . Prediabetes   . Seizures ([place])   . TBI (traumatic brain injury) [facility])    Memory issues   . Vitamin D deficiency    Social History   Socioeconomic History  . Marital status: Divorced    Spouse name: Not on file  . Number of children: Not on file  . Years of education: Not on file  . Highest education level: Not on file  Occupational History  . Not on file  Tobacco Use  . Smoking status: Never Smoker  . Smokeless tobacco: Never Used  Vaping Use  . Vaping Use: Never used  Substance and Sexual Activity  . Alcohol use: No  . Drug use: No  . Sexual activity: Not on file  Other Topics Concern  . Not on file  Social History Narrative  . Not on file   Social Determinants of Health   Financial Resource Strain:   . Difficulty of Paying Living Expenses: Not on file  Food Insecurity:   . Worried About Charity fundraiser in the Last Year: Not on file  . Ran Out of Food in the Last Year: Not on file  Transportation Needs:   . Lack of Transportation (Medical): Not on file  . Lack of Transportation (Non-Medical): Not on file  Physical Activity:   . Days of Exercise per Week: Not on file  . Minutes of Exercise per Session: Not on file  Stress:   . Feeling of Stress : Not on file  Social Connections:   .  Frequency of Communication with Friends and Family: Not on file  . Frequency of Social Gatherings with Friends and Family: Not on file  . Attends Religious Services: Not on file  . Active Member of Clubs or Organizations: Not on file  . Attends Archivist Meetings: Not on file  . Marital Status: Not on file  Intimate Partner Violence:   . Fear of Current or Ex-Partner: Not on file  . Emotionally Abused: Not on file  . Physically Abused: Not on file  . Sexually Abused: Not on file   Family History  Problem Relation Age of Onset  . Hypertension Mother   . Diabetes Mother   . Heart disease Father   . Cancer Brother   . Kidney disease Other  Past Surgical History:  Procedure Laterality Date  . ABDOMINAL HYSTERECTOMY     BSO  . BREAST BIOPSY Left   . KNEE ARTHROSCOPY Right       Vanessa Kick, MD 08/31/20 5784    Vanessa Kick, MD 08/31/20 [phone]

## 2020-09-07 ENCOUNTER — Telehealth: Payer: Self-pay | Admitting: Family Medicine

## 2020-09-07 NOTE — Telephone Encounter (Signed)
[facility] to resch appt for 12/20 due to provider being out of the office.

## 2020-09-12 ENCOUNTER — Encounter: Payer: Self-pay | Admitting: Family Medicine

## 2020-09-12 ENCOUNTER — Ambulatory Visit (INDEPENDENT_AMBULATORY_CARE_PROVIDER_SITE_OTHER): Payer: Medicare HMO | Admitting: Family Medicine

## 2020-09-12 ENCOUNTER — Other Ambulatory Visit: Payer: Self-pay

## 2020-09-12 VITALS — BP 134/80 | HR 78 | Temp 98.2°F | Ht 61.0 in | Wt 135.0 lb

## 2020-09-12 DIAGNOSIS — R002 Palpitations: Secondary | ICD-10-CM

## 2020-09-12 DIAGNOSIS — I10 Essential (primary) hypertension: Secondary | ICD-10-CM | POA: Diagnosis not present

## 2020-09-12 DIAGNOSIS — E7800 Pure hypercholesterolemia, unspecified: Secondary | ICD-10-CM

## 2020-09-12 DIAGNOSIS — R7303 Prediabetes: Secondary | ICD-10-CM

## 2020-09-12 MED ORDER — IRBESARTAN-HYDROCHLOROTHIAZIDE 300-12.5 MG PO TABS: 1.0000 | ORAL_TABLET | Freq: Every day | ORAL | 1 refills | Status: DC

## 2020-09-12 NOTE — Patient Instructions (Addendum)
Blood pressure looks okay today I would not recommend any changes in medicine for now.  See information (below for palpitations or leg irregular heart rates.  If those become more frequent, more intense,  or any associated symptoms like shortness of breath, lightheadedness, or dizziness be seen right away.  If those symptoms continue I am happy to refer you to a cardiologist for other testing.  Let me know if that occurs  Return to the clinic or go to the nearest emergency room if any of your symptoms worsen or new symptoms occur.  Thanks for coming in today.    If you have lab work done today you will be contacted with your lab results within the next 2 weeks.  If you have not heard from [place] then please contact us. The fastest way to get your results is to register for My Chart.   IF you received an x-ray today, you will receive an invoice from [facility]. Please contact [facility] at [phone] with questions or concerns regarding your invoice.   IF you received labwork today, you will receive an invoice from [facility]. Please contact [facility] at [phone] with questions or concerns regarding your invoice.   Our billing staff will not be able to assist you with questions regarding bills from these companies.  You will be contacted with the lab results as soon as they are available. The fastest way to get your results is to activate your My Chart account. Instructions are located on the last page of this paperwork. If you have not heard from [place] regarding the results in 2 weeks, please contact this office.

## 2020-09-12 NOTE — Progress Notes (Signed)
Subjective:  Patient ID: [name], female    DOB: 1956/04/25  Age: 64 y.o. MRN: [id number]  CC:  Chief Complaint  Patient presents with  . Medication Refill    on Hypertension medication. pt has a TOC appt with the provider on 11/29/2019. Pt reports no issues with BP since last OV with [name]. pt reports no physical symptoms of hypertension. pt reports she takes her medication as prescribed with no side effects that she has noticed.    HPI [name] presents for  Medication refill, previous patient [name].  Has a transition of care appointment with me in the next few months, needs refills of meds today.   Hypertension: Irbesartan hydrochlorothiazide 300/12.5 mg daily. Home readings: none lately, similar to here.  No new side effects.  Rare palpitations - beats faster at times, few seconds, no syncope/near syncope or associated dyspnea.  Past year, only on occasion or in stressful situations. No caffeine.  Taking daily.  Seen in urgent care few weeks ago for vertigo, EKG at that time with sinus rhythm.  Nonspecific T wave.  Thought to have benign positional vertigo.  Symptoms have.,  No recent symptoms. Lab Results  Component Value Date   WBC 3.4 (L) 08/30/2020   HGB 13.2 08/30/2020   HCT 39.8 08/30/2020   MCV 93.9 08/30/2020   PLT 149 (L) 08/30/2020    BP Readings from Last 3 Encounters:  09/12/20 134/80  08/30/20 133/65  07/05/20 138/84   Lab Results  Component Value Date   CREATININE 0.64 09/12/2020   Prediabetes: Lab Results  Component Value Date   HGBA1C 6.2 (H) 09/12/2020   Wt Readings from Last 3 Encounters:  09/12/20 135 lb (61.2 kg)  07/05/20 133 lb (60.3 kg)  06/24/20 133 lb 3.2 oz (60.4 kg)  no fast food usually, avoiding sweet tea/soda.   History Patient Active Problem List   Diagnosis Date Noted  . Encounter for Medicare annual wellness exam 04/30/2016  . Prediabetes 03/11/2014  . Hyperlipidemia   . Hypertension    . Vitamin D deficiency   . TBI (traumatic brain injury) [facility])    Past Medical History:  Diagnosis Date  . Arthritis    L knee  . Hyperlipidemia   . Hypertension   . Prediabetes   . Seizures ([place])   . TBI (traumatic brain injury) [facility])    Memory issues   . Vitamin D deficiency    Past Surgical History:  Procedure Laterality Date  . ABDOMINAL HYSTERECTOMY     BSO  . BREAST BIOPSY Left   . KNEE ARTHROSCOPY Right    No Known Allergies Prior to Admission medications   Medication Sig Start Date End Date Taking? Authorizing Provider  ACCU-CHEK AVIVA PLUS test strip  12/18/19  Yes [provider]  Accu-Chek Softclix Lancets lancets  12/18/19  Yes [provider]  Alcohol Swabs (B-D SINGLE USE SWABS REGULAR) PADS  12/18/19  Yes [provider]  Blood Glucose Monitoring Suppl (ACCU-CHEK AVIVA PLUS) w/Device KIT  12/18/19  Yes [provider]  Cholecalciferol (VITAMIN D PO) Take 5,000 Int'l Units by mouth daily.   Yes [provider]  irbesartan-hydrochlorothiazide (AVALIDE) 300-12.5 MG tablet  12/02/18  Yes [provider]  Cyanocobalamin (B-12 PO) Take 2,500 mcg by mouth daily. Patient not taking: Reported on 09/12/2020    [provider]  diclofenac sodium (VOLTAREN) 1 % GEL Apply 4 g topically 4 (four) times daily as needed. Patient not  taking: Reported on 09/12/2020 12/03/18   Hilts, Legrand Como, MD  meloxicam (MOBIC) 7.5 MG tablet Take 1 tablet (7.5 mg total) by mouth daily. Patient not taking: Reported on 09/12/2020 03/27/20   Loura Halt A, NP  ondansetron (ZOFRAN-ODT) 4 MG disintegrating tablet Take 1 tablet (4 mg total) by mouth every 8 (eight) hours as needed for nausea or vomiting. Patient not taking: Reported on 09/12/2020 08/30/20   Vanessa Kick, MD   Social History   Socioeconomic History  . Marital status: Divorced    Spouse name: Not on file  . Number of children: Not on file  . Years of education: Not on file  .  Highest education level: Not on file  Occupational History  . Not on file  Tobacco Use  . Smoking status: Never Smoker  . Smokeless tobacco: Never Used  Vaping Use  . Vaping Use: Never used  Substance and Sexual Activity  . Alcohol use: No  . Drug use: No  . Sexual activity: Not on file  Other Topics Concern  . Not on file  Social History Narrative  . Not on file   Social Determinants of Health   Financial Resource Strain:   . Difficulty of Paying Living Expenses: Not on file  Food Insecurity:   . Worried About Charity fundraiser in the Last Year: Not on file  . Ran Out of Food in the Last Year: Not on file  Transportation Needs:   . Lack of Transportation (Medical): Not on file  . Lack of Transportation (Non-Medical): Not on file  Physical Activity:   . Days of Exercise per Week: Not on file  . Minutes of Exercise per Session: Not on file  Stress:   . Feeling of Stress : Not on file  Social Connections:   . Frequency of Communication with Friends and Family: Not on file  . Frequency of Social Gatherings with Friends and Family: Not on file  . Attends Religious Services: Not on file  . Active Member of Clubs or Organizations: Not on file  . Attends Archivist Meetings: Not on file  . Marital Status: Not on file  Intimate Partner Violence:   . Fear of Current or Ex-Partner: Not on file  . Emotionally Abused: Not on file  . Physically Abused: Not on file  . Sexually Abused: Not on file    Review of Systems  Constitutional: Negative for fatigue and unexpected weight change.  Respiratory: Negative for chest tightness and shortness of breath.   Cardiovascular: Negative for chest pain, palpitations and leg swelling.  Gastrointestinal: Negative for abdominal pain and blood in stool.  Neurological: Negative for dizziness, syncope, light-headedness and headaches.     Objective:   Vitals:   09/12/20 1444 09/12/20 1448 09/12/20 1520  BP: (!) 159/86 (!)  144/82 134/80  Pulse: 78    Temp: 98.2 F (36.8 C)    TempSrc: Temporal    SpO2: 100%    Weight: 135 lb (61.2 kg)    Height: 5' 1"  (1.549 m)       Physical Exam Vitals reviewed.  Constitutional:      Appearance: She is well-developed.  HENT:     Head: Normocephalic and atraumatic.  Eyes:     Conjunctiva/sclera: Conjunctivae normal.     Pupils: Pupils are equal, round, and reactive to light.  Neck:     Vascular: No carotid bruit.  Cardiovascular:     Rate and Rhythm: Normal rate and regular rhythm.  Heart sounds: Normal heart sounds.  Pulmonary:     Effort: Pulmonary effort is normal.     Breath sounds: Normal breath sounds.  Abdominal:     Palpations: Abdomen is soft. There is no pulsatile mass.     Tenderness: There is no abdominal tenderness.  Skin:    General: Skin is warm and dry.  Neurological:     Mental Status: She is alert and oriented to person, place, and time.  Psychiatric:        Behavior: Behavior normal.        Assessment & Plan:  [name] is a 64 y.o. female . Essential hypertension - Plan: irbesartan-hydrochlorothiazide (AVALIDE) 300-12.5 MG tablet, Comprehensive metabolic panel  -  Stable on repeat check . tolerating current regimen. Medications refilled. Labs pending as above.   Pure hypercholesterolemia - Plan: Lipid panel  -No current meds, previous labs okay, updated labs ordered.  Prediabetes - Plan: Hemoglobin A1c  -Continue to watch diet, activity/exercise, check A1c  Palpitations - Plan: TSH  -Recent eval for dizziness, asymptomatic exam.  RTC precautions given.  Rare palpitations, check TSH, previous CBC, EKG reassuring.  RTC/ER precautions given, possible cardiology follow-up if persistent, handout given.  Meds ordered this encounter  Medications  . irbesartan-hydrochlorothiazide (AVALIDE) 300-12.5 MG tablet    Sig: Take 1 tablet by mouth daily.    Dispense:  90 tablet    Refill:  1   Patient Instructions    Blood pressure looks okay today I would not recommend any changes in medicine for now.  See information (below for palpitations or leg irregular heart rates.  If those become more frequent, more intense,  or any associated symptoms like shortness of breath, lightheadedness, or dizziness be seen right away.  If those symptoms continue I am happy to refer you to a cardiologist for other testing.  Let me know if that occurs  Return to the clinic or go to the nearest emergency room if any of your symptoms worsen or new symptoms occur.  Thanks for coming in today.    If you have lab work done today you will be contacted with your lab results within the next 2 weeks.  If you have not heard from [place] then please contact us. The fastest way to get your results is to register for My Chart.   IF you received an x-ray today, you will receive an invoice from [facility]. Please contact [facility] Radiology at [phone] with questions or concerns regarding your invoice.   IF you received labwork today, you will receive an invoice from [facility]. Please contact [facility] at [phone] with questions or concerns regarding your invoice.   Our billing staff will not be able to assist you with questions regarding bills from these companies.  You will be contacted with the lab results as soon as they are available. The fastest way to get your results is to activate your My Chart account. Instructions are located on the last page of this paperwork. If you have not heard from [place] regarding the results in 2 weeks, please contact this office.         Signed, Merri Ray, MD [facility]

## 2020-09-13 ENCOUNTER — Encounter: Payer: Self-pay | Admitting: Family Medicine

## 2020-09-13 ENCOUNTER — Encounter: Payer: Self-pay | Admitting: Radiology

## 2020-09-13 LAB — COMPREHENSIVE METABOLIC PANEL
ALT: 14 IU/L (ref 0–32)
AST: 15 IU/L (ref 0–40)
Albumin/Globulin Ratio: 1.3 (ref 1.2–2.2)
Albumin: 4.4 g/dL (ref 3.8–4.8)
Alkaline Phosphatase: 99 IU/L (ref 44–121)
BUN/Creatinine Ratio: 22 (ref 12–28)
BUN: 14 mg/dL (ref 8–27)
Bilirubin Total: 0.6 mg/dL (ref 0.0–1.2)
CO2: 29 mmol/L (ref 20–29)
Calcium: 9.7 mg/dL (ref 8.7–10.3)
Chloride: 102 mmol/L (ref 96–106)
Creatinine, Ser: 0.64 mg/dL (ref 0.57–1.00)
GFR calc Af Amer: 110 mL/min/{1.73_m2} (ref >59)
GFR calc non Af Amer: 95 mL/min/{1.73_m2} (ref >59)
Globulin, Total: 3.3 g/dL (ref 1.5–4.5)
Glucose: 80 mg/dL (ref 65–99)
Potassium: 3.6 mmol/L (ref 3.5–5.2)
Sodium: 141 mmol/L (ref 134–144)
Total Protein: 7.7 g/dL (ref 6.0–8.5)

## 2020-09-13 LAB — HEMOGLOBIN A1C
Est. average glucose Bld gHb Est-mCnc: 131 mg/dL
Hgb A1c MFr Bld: 6.2 % — ABNORMAL HIGH (ref 4.8–5.6)

## 2020-09-13 LAB — TSH: TSH: 3.22 u[IU]/mL (ref 0.450–4.500)

## 2020-09-13 LAB — LIPID PANEL
Chol/HDL Ratio: 2.5 ratio (ref 0.0–4.4)
Cholesterol, Total: 170 mg/dL (ref 100–199)
HDL: 67 mg/dL (ref >39)
LDL Chol Calc (NIH): 87 mg/dL (ref 0–99)
Triglycerides: 90 mg/dL (ref 0–149)
VLDL Cholesterol Cal: 16 mg/dL (ref 5–40)

## 2020-09-15 ENCOUNTER — Telehealth: Payer: Self-pay | Admitting: Family Medicine

## 2020-09-15 NOTE — Telephone Encounter (Signed)
Pt stated she received a missed call form our office. I did not see a message in that we called her but she is calling our office back and would like a call back. Please advise.

## 2020-10-03 ENCOUNTER — Encounter: Payer: Medicare HMO | Admitting: Family Medicine

## 2020-10-20 ENCOUNTER — Ambulatory Visit (INDEPENDENT_AMBULATORY_CARE_PROVIDER_SITE_OTHER): Payer: Medicare HMO | Admitting: Family Medicine

## 2020-10-20 VITALS — BP 134/80 | Ht 61.0 in | Wt 135.0 lb

## 2020-10-20 DIAGNOSIS — Z0000 Encounter for general adult medical examination without abnormal findings: Secondary | ICD-10-CM

## 2020-10-20 NOTE — Patient Instructions (Addendum)
Thank you for taking time to come for your Medicare Wellness Visit. I appreciate your ongoing commitment to your health goals. Please review the following plan we discussed and let me know if I can assist you in the future.  Leroy Kennedy LPN  Preventive Care 54-64 Years Old, Female Preventive care refers to visits with your health care provider and lifestyle choices that can promote health and wellness. This includes:  A yearly physical exam. This may also be called an annual well check.  Regular dental visits and eye exams.  Immunizations.  Screening for certain conditions.  Healthy lifestyle choices, such as eating a healthy diet, getting regular exercise, not using drugs or products that contain nicotine and tobacco, and limiting alcohol use. What can I expect for my preventive care visit? Physical exam Your health care provider will check your:  Height and weight. This may be used to calculate body mass index (BMI), which tells if you are at a healthy weight.  Heart rate and blood pressure.  Skin for abnormal spots. Counseling Your health care provider may ask you questions about your:  Alcohol, tobacco, and drug use.  Emotional well-being.  Home and relationship well-being.  Sexual activity.  Eating habits.  Work and work Statistician.  Method of birth control.  Menstrual cycle.  Pregnancy history. What immunizations do I need?  Influenza (flu) vaccine  This is recommended every year. Tetanus, diphtheria, and pertussis (Tdap) vaccine  You may need a Td booster every 10 years. Varicella (chickenpox) vaccine  You may need this if you have not been vaccinated. Zoster (shingles) vaccine  You may need this after age 25. Measles, mumps, and rubella (MMR) vaccine  You may need at least one dose of MMR if you were born in 1957 or later. You may also need a second dose. Pneumococcal conjugate (PCV13) vaccine  You may need this if you have certain conditions  and were not previously vaccinated. Pneumococcal polysaccharide (PPSV23) vaccine  You may need one or two doses if you smoke cigarettes or if you have certain conditions. Meningococcal conjugate (MenACWY) vaccine  You may need this if you have certain conditions. Hepatitis A vaccine  You may need this if you have certain conditions or if you travel or work in places where you may be exposed to hepatitis A. Hepatitis B vaccine  You may need this if you have certain conditions or if you travel or work in places where you may be exposed to hepatitis B. Haemophilus influenzae type b (Hib) vaccine  You may need this if you have certain conditions. Human papillomavirus (HPV) vaccine  If recommended by your health care provider, you may need three doses over 6 months. You may receive vaccines as individual doses or as more than one vaccine together in one shot (combination vaccines). Talk with your health care provider about the risks and benefits of combination vaccines. What tests do I need? Blood tests  Lipid and cholesterol levels. These may be checked every 5 years, or more frequently if you are over 64 years old.  Hepatitis C test.  Hepatitis B test. Screening  Lung cancer screening. You may have this screening every year starting at age 8 if you have a 30-pack-year history of smoking and currently smoke or have quit within the past 15 years.  Colorectal cancer screening. All adults should have this screening starting at age 103 and continuing until age 42. Your health care provider may recommend screening at age 57 if you are  at increased risk. You will have tests every 1-10 years, depending on your results and the type of screening test.  Diabetes screening. This is done by checking your blood sugar (glucose) after you have not eaten for a while (fasting). You may have this done every 1-3 years.  Mammogram. This may be done every 1-2 years. Talk with your health care provider  about when you should start having regular mammograms. This may depend on whether you have a family history of breast cancer.  BRCA-related cancer screening. This may be done if you have a family history of breast, ovarian, tubal, or peritoneal cancers.  Pelvic exam and Pap test. This may be done every 3 years starting at age 28. Starting at age 45, this may be done every 5 years if you have a Pap test in combination with an HPV test. Other tests  Sexually transmitted disease (STD) testing.  Bone density scan. This is done to screen for osteoporosis. You may have this scan if you are at high risk for osteoporosis. Follow these instructions at home: Eating and drinking  Eat a diet that includes fresh fruits and vegetables, whole grains, lean protein, and low-fat dairy.  Take vitamin and mineral supplements as recommended by your health care provider.  Do not drink alcohol if: ? Your health care provider tells you not to drink. ? You are pregnant, may be pregnant, or are planning to become pregnant.  If you drink alcohol: ? Limit how much you have to 0-1 drink a day. ? Be aware of how much alcohol is in your drink. In the [place] one drink equals one 12 oz bottle of beer (355 mL), one 5 oz glass of wine (148 mL), or one 1 oz glass of hard liquor (44 mL). Lifestyle  Take daily care of your teeth and gums.  Stay active. Exercise for at least 30 minutes on 5 or more days each week.  Do not use any products that contain nicotine or tobacco, such as cigarettes, e-cigarettes, and chewing tobacco. If you need help quitting, ask your health care provider.  If you are sexually active, practice safe sex. Use a condom or other form of birth control (contraception) in order to prevent pregnancy and STIs (sexually transmitted infections).  If told by your health care provider, take low-dose aspirin daily starting at age 67. What's next?  Visit your health care provider once a year for a well  check visit.  Ask your health care provider how often you should have your eyes and teeth checked.  Stay up to date on all vaccines. This information is not intended to replace advice given to you by your health care provider. Make sure you discuss any questions you have with your health care provider. Document Revised: 06/12/2018 Document Reviewed: 06/12/2018 Elsevier Patient Education  2020 [employer].

## 2020-10-20 NOTE — Progress Notes (Signed)
Presents today for [employer] Visit   Date of last exam: 09-12-2020  Interpreter used for this visit?  I connected with  Kjirsten Bloodgood on 10/20/20 by a telephone  and verified that I am speaking with the correct person using two identifiers.   I discussed the limitations of evaluation and management by telemedicine. The patient expressed understanding and agreed to proceed.  Patient location: home  Provider location: in office  I provided 20 minutes of non face - to - face time during this encounter.   Patient Care Team: Rolm Bookbinder, MD as Consulting Physician (Dermatology) Richmond Campbell, MD as Consulting Physician (Gastroenterology)   Other items to address today:   Discussed Eye/Dental Discussed Immunizations Follow up scheduled 2;14 @ 2:00   Other Screening: Last screening for diabetes: 09/12/2020 Last lipid screening: 09/12/2020  ADVANCE DIRECTIVES: Discussed: yes On File: no Materials Provided: yes  Immunization status:  Immunization History  Administered Date(s) Administered  . Influenza Inj Mdck Quad Pf 07/29/2018  . Influenza Split 07/16/2013  . Influenza,inj,Quad PF,6+ Mos 06/24/2020  . Influenza-Unspecified 06/23/2020  . Moderna Sars-Covid-2 Vaccination 12/25/2019, 01/25/2020  . Pneumococcal Polysaccharide-23 04/14/2013  . Tdap 09/25/2011     Health Maintenance Due  Topic Date Due  . PAP SMEAR-Modifier  10/17/2018  . COVID-19 Vaccine (3 - Booster for Moderna series) 07/26/2020     Functional Status Survey: Is the patient deaf or have difficulty hearing?: No Does the patient have difficulty seeing, even when wearing glasses/contacts?: No Does the patient have difficulty concentrating, remembering, or making decisions?: No Does the patient have difficulty walking or climbing stairs?: No Does the patient have difficulty dressing or bathing?: No Does the patient have difficulty doing errands alone such as visiting  a doctor's office or shopping?: No   6CIT Screen 10/20/2020  What Year? 0 points  What month? 0 points  What time? 0 points  Count back from 20 2 points  Months in reverse 2 points  Repeat phrase 2 points  Total Score 6      [place] from 10/20/2020 in [facility] [place]  AUDIT-C Score 0       Home Environment:   No trouble  Climbing stairs Lives in a one story  Home Yes grab bars Yes scattered rugs Advised to remove or put grabbers on bottom Adequate lighting/ no clutter   Patient Active Problem List   Diagnosis Date Noted  . Encounter for Medicare annual wellness exam 04/30/2016  . Prediabetes 03/11/2014  . Hyperlipidemia   . Hypertension   . Vitamin D deficiency   . TBI (traumatic [facility] injury) [facility])      Past Medical History:  Diagnosis Date  . Arthritis    L knee  . Hyperlipidemia   . Hypertension   . Prediabetes   . Seizures ([place])   . TBI (traumatic brain injury) [facility])    Memory issues   . Vitamin D deficiency      Past Surgical History:  Procedure Laterality Date  . ABDOMINAL HYSTERECTOMY     BSO  . BREAST BIOPSY Left   . KNEE ARTHROSCOPY Right      Family History  Problem Relation Age of Onset  . Hypertension Mother   . Diabetes Mother   . Heart disease Father   . Cancer Brother   . Kidney disease Other      Social History   Socioeconomic History  . Marital status: Divorced  Spouse name: Not on file  . Number of children: Not on file  . Years of education: Not on file  . Highest education level: Not on file  Occupational History  . Not on file  Tobacco Use  . Smoking status: Never Smoker  . Smokeless tobacco: Never Used  Vaping Use  . Vaping Use: Never used  Substance and Sexual Activity  . Alcohol use: No  . Drug use: No  . Sexual activity: Not on file  Other Topics Concern  . Not on file  Social History Narrative  . Not on file   Social Determinants of Health   Financial Resource  Strain: Not on file  Food Insecurity: Not on file  Transportation Needs: Not on file  Physical Activity: Not on file  Stress: Not on file  Social Connections: Not on file  Intimate Partner Violence: Not on file     No Known Allergies   Prior to Admission medications   Medication Sig Start Date End Date Taking? Authorizing Provider  ACCU-CHEK AVIVA PLUS test strip  12/18/19  Yes [provider]  Accu-Chek Softclix Lancets lancets  12/18/19  Yes [provider]  Alcohol Swabs (B-D SINGLE USE SWABS REGULAR) PADS  12/18/19  Yes [provider]  Blood Glucose Monitoring Suppl (ACCU-CHEK AVIVA PLUS) w/Device KIT  12/18/19  Yes [provider]  Cholecalciferol (VITAMIN D PO) Take 5,000 Int'l Units by mouth daily.   Yes [provider]  irbesartan-hydrochlorothiazide (AVALIDE) 300-12.5 MG tablet Take 1 tablet by mouth daily. 09/12/20  Yes Wendie Agreste, MD     Depression screen [facility] 2/9 10/20/2020 09/12/2020 02/04/2020 04/30/2016 10/18/2015  Decreased Interest 0 0 0 0 0  Down, Depressed, Hopeless 0 0 0 0 0  PHQ - 2 Score 0 0 0 0 0     Fall Risk  10/20/2020 09/12/2020 02/04/2020 04/30/2016 10/18/2015  Falls in the past year? 0 0 0 No Yes  Number falls in past yr: 0 - 0 - 1  Injury with Fall? 0 - 0 - Yes  Risk Factor Category  - - - - High Fall Risk  Risk for fall due to : - - - - History of fall(s)  Follow up Falls evaluation completed;Education provided Falls evaluation completed Falls evaluation completed - Falls evaluation completed;Education provided;Follow up appointment;Falls prevention discussed      PHYSICAL EXAM: BP 134/80 Comment: taken from a previous visit/ not in clinic  Ht 5' 1"  (1.549 m)   Wt 135 lb (61.2 kg)   BMI 25.51 kg/m    Wt Readings from Last 3 Encounters:  10/20/20 135 lb (61.2 kg)  09/12/20 135 lb (61.2 kg)  07/05/20 133 lb (60.3 kg)      Education/Counseling provided regarding diet and exercise, prevention of  chronic diseases, smoking/tobacco cessation, if applicable, and reviewed "Covered Medicare Preventive Services."

## 2020-11-28 ENCOUNTER — Ambulatory Visit (INDEPENDENT_AMBULATORY_CARE_PROVIDER_SITE_OTHER): Payer: Medicare HMO | Admitting: Family Medicine

## 2020-11-28 ENCOUNTER — Other Ambulatory Visit: Payer: Self-pay

## 2020-11-28 ENCOUNTER — Encounter: Payer: Self-pay | Admitting: Family Medicine

## 2020-11-28 VITALS — BP 138/83 | HR 82 | Temp 98.1°F | Resp 14 | Ht 61.0 in | Wt 134.8 lb

## 2020-11-28 DIAGNOSIS — R059 Cough, unspecified: Secondary | ICD-10-CM | POA: Diagnosis not present

## 2020-11-28 DIAGNOSIS — R7303 Prediabetes: Secondary | ICD-10-CM | POA: Diagnosis not present

## 2020-11-28 DIAGNOSIS — I10 Essential (primary) hypertension: Secondary | ICD-10-CM | POA: Diagnosis not present

## 2020-11-28 MED ORDER — IRBESARTAN-HYDROCHLOROTHIAZIDE 300-12.5 MG PO TABS: 1.0000 | ORAL_TABLET | Freq: Every day | ORAL | 1 refills | Status: DC

## 2020-11-28 MED ORDER — ACCU-CHEK AVIVA PLUS VI STRP
ORAL_STRIP | 0 refills | Status: DC
Start: 2020-11-28 — End: 2024-07-23

## 2020-11-28 MED ORDER — FLUTICASONE PROPIONATE 50 MCG/ACT NA SUSP: 1.0000 | Freq: Every day | NASAL | 6 refills | Status: DC

## 2020-11-28 MED ORDER — ACCU-CHEK SOFTCLIX LANCETS MISC: 0 refills | Status: DC

## 2020-11-28 NOTE — Progress Notes (Signed)
Subjective:  Patient ID: [name], female    DOB: July 27, 1956  Age: 65 y.o. MRN: [id number]  CC:  Chief Complaint  Patient presents with  . Establish Care    Pt here today to do TOC no concerns needs some medication refills   . Diabetes    Pt needs more test strips and lancets, doing well no concerns for diabetes today   . Hypertension    Pt needs refill irbesartan HCTZ takes BP at home on the occasion and is good, dnies physical symptoms     HPI [name] presents for  Transition care, however I did see her November.  Previous patient [name].  Hypertension: Irbesartan hydrochlorothiazide 300/12.5 mg daily.  No missed doses or new side effects.  Home readings: normal, but unknown specifics.  BP Readings from Last 3 Encounters:  11/28/20 138/83  10/20/20 134/80  09/12/20 134/80   Lab Results  Component Value Date   CREATININE 0.64 09/12/2020   Prediabetes: Walking for exercise, some exercise at home.  Turmeric in food. Son is vegan - eating more plant based.  No soda/sweet tea. Drinks water.  Some take out at times.  Stable A1c 3 months ago.  Home reading 85 this am. Checking every other week.  Lab Results  Component Value Date   HGBA1C 6.2 (H) 09/12/2020   Wt Readings from Last 3 Encounters:  11/28/20 134 lb 12.8 oz (61.1 kg)  10/20/20 135 lb (61.2 kg)  09/12/20 135 lb (61.2 kg)    Cough: Notice lying down at night. Better if clearing nose - clear  No dyspnea or chest pain.  soreness in left side of face at times.  Min cough at day, usually at night. No heartburn.   History Patient Active Problem List   Diagnosis Date Noted  . Encounter for Medicare annual wellness exam 04/30/2016  . Prediabetes 03/11/2014  . Hyperlipidemia   . Hypertension   . Vitamin D deficiency   . TBI (traumatic brain injury) [name])    Past Medical History:  Diagnosis Date  . Arthritis    L knee  . Hyperlipidemia   . Hypertension   . Prediabetes    . Seizures ([name])   . TBI (traumatic brain injury) [facility])    Memory issues   . Vitamin D deficiency    Past Surgical History:  Procedure Laterality Date  . ABDOMINAL HYSTERECTOMY     BSO  . BREAST BIOPSY Left   . KNEE ARTHROSCOPY Right    No Known Allergies Prior to Admission medications   Medication Sig Start Date End Date Taking? Authorizing Provider  ACCU-CHEK AVIVA PLUS test strip  12/18/19  Yes [provider]  Accu-Chek Softclix Lancets lancets  12/18/19  Yes [provider]  Alcohol Swabs (B-D SINGLE USE SWABS REGULAR) PADS  12/18/19  Yes [provider]  Blood Glucose Monitoring Suppl (ACCU-CHEK AVIVA PLUS) w/Device KIT  12/18/19  Yes [provider]  Cholecalciferol (VITAMIN D PO) Take 5,000 Int'l Units by mouth daily.   Yes [provider]  irbesartan-hydrochlorothiazide (AVALIDE) 300-12.5 MG tablet Take 1 tablet by mouth daily. 09/12/20  Yes Wendie Agreste, MD   Social History   Socioeconomic History  . Marital status: Divorced    Spouse name: Not on file  . Number of children: Not on file  . Years of education: Not on file  . Highest education level: Not on file  Occupational History  . Not on file  Tobacco  Use  . Smoking status: Never Smoker  . Smokeless tobacco: Never Used  Vaping Use  . Vaping Use: Never used  Substance and Sexual Activity  . Alcohol use: No  . Drug use: No  . Sexual activity: Not on file  Other Topics Concern  . Not on file  Social History Narrative  . Not on file   Social Determinants of Health   Financial Resource Strain: Not on file  Food Insecurity: Not on file  Transportation Needs: Not on file  Physical Activity: Not on file  Stress: Not on file  Social Connections: Not on file  Intimate Partner Violence: Not on file    Review of Systems  Constitutional: Negative for fatigue and unexpected weight change.  Respiratory: Negative for chest tightness and shortness of breath.    Cardiovascular: Negative for chest pain, palpitations and leg swelling.  Gastrointestinal: Negative for abdominal pain and blood in stool.  Neurological: Negative for dizziness, syncope, light-headedness and headaches.     Objective:   Vitals:   11/28/20 1413  BP: 138/83  Pulse: 82  Resp: 14  Temp: 98.1 F (36.7 C)  TempSrc: Temporal  SpO2: 98%  Weight: 134 lb 12.8 oz (61.1 kg)  Height: 5' 1"  (1.549 m)     Physical Exam Vitals reviewed.  Constitutional:      Appearance: She is well-developed and well-nourished.  HENT:     Head: Normocephalic and atraumatic.  Eyes:     Extraocular Movements: EOM normal.     Conjunctiva/sclera: Conjunctivae normal.     Pupils: Pupils are equal, round, and reactive to light.  Neck:     Vascular: No carotid bruit.  Cardiovascular:     Rate and Rhythm: Normal rate and regular rhythm.     Pulses: Intact distal pulses.     Heart sounds: Normal heart sounds.  Pulmonary:     Effort: Pulmonary effort is normal.     Breath sounds: Normal breath sounds.  Abdominal:     Palpations: Abdomen is soft. There is no pulsatile mass.     Tenderness: There is no abdominal tenderness.  Skin:    General: Skin is warm and dry.  Neurological:     Mental Status: She is alert and oriented to person, place, and time.  Psychiatric:        Mood and Affect: Mood and affect normal.        Behavior: Behavior normal.      Assessment & Plan:  [name] is a 65 y.o. female . Prediabetes - Plan: ACCU-CHEK AVIVA PLUS test strip, Accu-Chek Softclix Lancets lancets  -Commended on diet change.  Plan for repeat A1c in 3 months  Essential hypertension - Plan: irbesartan-hydrochlorothiazide (AVALIDE) 300-12.5 MG tablet  -Borderline but stable, monitor home readings, handout on hypertension management.  Cough - Plan: fluticasone (FLONASE) 50 MCG/ACT nasal spray  -Possible postnasal drip with Zadin Lange symptoms.  Evening dosing of fluticasone discussed  with RTC precautions if persistent.  Meds ordered this encounter  Medications  . ACCU-CHEK AVIVA PLUS test strip    Sig: Check readings once per week.    Dispense:  100 each    Refill:  0  . Accu-Chek Softclix Lancets lancets    Sig: Check reading once per week,    Dispense:  100 each    Refill:  0  . irbesartan-hydrochlorothiazide (AVALIDE) 300-12.5 MG tablet    Sig: Take 1 tablet by mouth daily.    Dispense:  90 tablet  Refill:  1    Ok to place on hold  . fluticasone (FLONASE) 50 MCG/ACT nasal spray    Sig: Place 1 spray into both nostrils daily.    Dispense:  16 g    Refill:  6   Patient Instructions   No change in meds today, but if higher readings at home let me know. Ideally under 130/80 most of the time.   Keep up the good work with diet.    Try nasal spray, 1 spray in each nostril around dinnertime.  If that does not help your cough at night, please schedule follow-up appointment.  Follow-up sooner if any worsening symptoms.  Managing Your Hypertension Hypertension, also called high blood pressure, is when the force of the blood pressing against the walls of the arteries is too strong. Arteries are blood vessels that carry blood from your heart throughout your body. Hypertension forces the heart to work harder to pump blood and may cause the arteries to become narrow or stiff. Understanding blood pressure readings Your personal target blood pressure may vary depending on your medical conditions, your age, and other factors. A blood pressure reading includes a higher number over a lower number. Ideally, your blood pressure should be below 120/80. You should know that:  The first, or top, number is called the systolic pressure. It is a measure of the pressure in your arteries as your heart beats.  The second, or bottom number, is called the diastolic pressure. It is a measure of the pressure in your arteries as the heart relaxes. Blood pressure is classified into four  stages. Based on your blood pressure reading, your health care provider may use the following stages to determine what type of treatment you need, if any. Systolic pressure and diastolic pressure are measured in a unit called mmHg. Normal  Systolic pressure: below 419.  Diastolic pressure: below 80. Elevated  Systolic pressure: 379-024.  Diastolic pressure: below 80. Hypertension stage 1  Systolic pressure: 097-353.  Diastolic pressure: 29-92. Hypertension stage 2  Systolic pressure: 426 or above.  Diastolic pressure: 90 or above. How can this condition affect me? Managing your hypertension is an important responsibility. Over time, hypertension can damage the arteries and decrease blood flow to important parts of the body, including the brain, heart, and kidneys. Having untreated or uncontrolled hypertension can lead to:  A heart attack.  A stroke.  A weakened blood vessel (aneurysm).  Heart failure.  Kidney damage.  Eye damage.  Metabolic syndrome.  Memory and concentration problems.  Vascular dementia. What actions can I take to manage this condition? Hypertension can be managed by making lifestyle changes and possibly by taking medicines. Your health care provider will help you make a plan to bring your blood pressure within a normal range. Nutrition  Eat a diet that is high in fiber and potassium, and low in salt (sodium), added sugar, and fat. An example eating plan is called the Dietary Approaches to Stop Hypertension (DASH) diet. To eat this way: ? Eat plenty of fresh fruits and vegetables. Try to fill one-half of your plate at each meal with fruits and vegetables. ? Eat whole grains, such as whole-wheat pasta, brown rice, or whole-grain bread. Fill about one-fourth of your plate with whole grains. ? Eat low-fat dairy products. ? Avoid fatty cuts of meat, processed or cured meats, and poultry with skin. Fill about one-fourth of your plate with lean proteins  such as fish, chicken without skin, beans, eggs, and tofu. ?  Avoid pre-made and processed foods. These tend to be higher in sodium, added sugar, and fat.  Reduce your daily sodium intake. Most people with hypertension should eat less than 1,500 mg of sodium a day.   Lifestyle  Work with your health care provider to maintain a healthy body weight or to lose weight. Ask what an ideal weight is for you.  Get at least 30 minutes of exercise that causes your heart to beat faster (aerobic exercise) most days of the week. Activities may include walking, swimming, or biking.  Include exercise to strengthen your muscles (resistance exercise), such as weight lifting, as part of your weekly exercise routine. Try to do these types of exercises for 30 minutes at least 3 days a week.  Do not use any products that contain nicotine or tobacco, such as cigarettes, e-cigarettes, and chewing tobacco. If you need help quitting, ask your health care provider.  Control any long-term (chronic) conditions you have, such as high cholesterol or diabetes.  Identify your sources of stress and find ways to manage stress. This may include meditation, deep breathing, or making time for fun activities.   Alcohol use  Do not drink alcohol if: ? Your health care provider tells you not to drink. ? You are pregnant, may be pregnant, or are planning to become pregnant.  If you drink alcohol: ? Limit how Horton you use to:  0-1 drink a day for women.  0-2 drinks a day for men. ? Be aware of how Horton alcohol is in your drink. In the [place] one drink equals one 12 oz bottle of beer (355 mL), one 5 oz glass of wine (148 mL), or one 1 oz glass of hard liquor (44 mL). Medicines Your health care provider may prescribe medicine if lifestyle changes are not enough to get your blood pressure under control and if:  Your systolic blood pressure is 130 or higher.  Your diastolic blood pressure is 80 or higher. Take medicines only  as told by your health care provider. Follow the directions carefully. Blood pressure medicines must be taken as told by your health care provider. The medicine does not work as well when you skip doses. Skipping doses also puts you at risk for problems. Monitoring Before you monitor your blood pressure:  Do not smoke, drink caffeinated beverages, or exercise within 30 minutes before taking a measurement.  Use the bathroom and empty your bladder (urinate).  Sit quietly for at least 5 minutes before taking measurements. Monitor your blood pressure at home as told by your health care provider. To do this:  Sit with your back straight and supported.  Place your feet flat on the floor. Do not cross your legs.  Support your arm on a flat surface, such as a table. Make sure your upper arm is at heart level.  Each time you measure, take two or three readings one minute apart and record the results. You may also need to have your blood pressure checked regularly by your health care provider.   General information  Talk with your health care provider about your diet, exercise habits, and other lifestyle factors that may be contributing to hypertension.  Review all the medicines you take with your health care provider because there may be side effects or interactions.  Keep all visits as told by your health care provider. Your health care provider can help you create and adjust your plan for managing your high blood pressure. Where to find more information  National Heart, Lung, and Blood Institute: https://wilson-eaton.com/  American Heart Association: www.heart.org Contact a health care provider if:  You think you are having a reaction to medicines you have taken.  You have repeated (recurrent) headaches.  You feel dizzy.  You have swelling in your ankles.  You have trouble with your vision. Get help right away if:  You develop a severe headache or confusion.  You have unusual weakness  or numbness, or you feel faint.  You have severe pain in your chest or abdomen.  You vomit repeatedly.  You have trouble breathing. These symptoms may represent a serious problem that is an emergency. Do not wait to see if the symptoms will go away. Get medical help right away. Call your local emergency services (911 in the [place] Do not drive yourself to the hospital. Summary  Hypertension is when the force of blood pumping through your arteries is too strong. If this condition is not controlled, it may put you at risk for serious complications.  Your personal target blood pressure may vary depending on your medical conditions, your age, and other factors. For most people, a normal blood pressure is less than 120/80.  Hypertension is managed by lifestyle changes, medicines, or both.  Lifestyle changes to help manage hypertension include losing weight, eating a healthy, low-sodium diet, exercising more, stopping smoking, and limiting alcohol. This information is not intended to replace advice given to you by your health care provider. Make sure you discuss any questions you have with your health care provider. Document Revised: 11/06/2019 Document Reviewed: 09/01/2019 Elsevier Patient Education  2021 [employer].   If you have lab work done today you will be contacted with your lab results within the next 2 weeks.  If you have not heard from [place] then please contact us. The fastest way to get your results is to register for My Chart.   IF you received an x-ray today, you will receive an invoice from [facility]. Please contact [facility] at [phone] with questions or concerns regarding your invoice.   IF you received labwork today, you will receive an invoice from [facility]. Please contact [facility] at [phone] with questions or concerns regarding your invoice.   Our billing staff will not be able to assist you with questions regarding bills from these  companies.  You will be contacted with the lab results as soon as they are available. The fastest way to get your results is to activate your My Chart account. Instructions are located on the last page of this paperwork. If you have not heard from [place] regarding the results in 2 weeks, please contact this office.         Signed, Merri Ray, MD [facility]

## 2020-11-28 NOTE — Patient Instructions (Addendum)
No change in meds today, but if higher readings at home let me know. Ideally under 130/80 most of the time.   Keep up the good work with diet.    Try nasal spray, 1 spray in each nostril around dinnertime.  If that does not help your cough at night, please schedule follow-up appointment.  Follow-up sooner if any worsening symptoms.  Managing Your Hypertension Hypertension, also called high blood pressure, is when the force of the blood pressing against the walls of the arteries is too strong. Arteries are blood vessels that carry blood from your heart throughout your body. Hypertension forces the heart to work harder to pump blood and may cause the arteries to become narrow or stiff. Understanding blood pressure readings Your personal target blood pressure may vary depending on your medical conditions, your age, and other factors. A blood pressure reading includes a higher number over a lower number. Ideally, your blood pressure should be below 120/80. You should know that:  The first, or top, number is called the systolic pressure. It is a measure of the pressure in your arteries as your heart beats.  The second, or bottom number, is called the diastolic pressure. It is a measure of the pressure in your arteries as the heart relaxes. Blood pressure is classified into four stages. Based on your blood pressure reading, your health care provider may use the following stages to determine what type of treatment you need, if any. Systolic pressure and diastolic pressure are measured in a unit called mmHg. Normal  Systolic pressure: below 401.  Diastolic pressure: below 80. Elevated  Systolic pressure: 027-253.  Diastolic pressure: below 80. Hypertension stage 1  Systolic pressure: 664-403.  Diastolic pressure: 47-42. Hypertension stage 2  Systolic pressure: 595 or above.  Diastolic pressure: 90 or above. How can this condition affect me? Managing your hypertension is an important  responsibility. Over time, hypertension can damage the arteries and decrease blood flow to important parts of the body, including the brain, heart, and kidneys. Having untreated or uncontrolled hypertension can lead to:  A heart attack.  A stroke.  A weakened blood vessel (aneurysm).  Heart failure.  Kidney damage.  Eye damage.  Metabolic syndrome.  Memory and concentration problems.  Vascular dementia. What actions can I take to manage this condition? Hypertension can be managed by making lifestyle changes and possibly by taking medicines. Your health care provider will help you make a plan to bring your blood pressure within a normal range. Nutrition  Eat a diet that is high in fiber and potassium, and low in salt (sodium), added sugar, and fat. An example eating plan is called the Dietary Approaches to Stop Hypertension (DASH) diet. To eat this way: ? Eat plenty of fresh fruits and vegetables. Try to fill one-half of your plate at each meal with fruits and vegetables. ? Eat whole grains, such as whole-wheat pasta, brown rice, or whole-grain bread. Fill about one-fourth of your plate with whole grains. ? Eat low-fat dairy products. ? Avoid fatty cuts of meat, processed or cured meats, and poultry with skin. Fill about one-fourth of your plate with lean proteins such as fish, chicken without skin, beans, eggs, and tofu. ? Avoid pre-made and processed foods. These tend to be higher in sodium, added sugar, and fat.  Reduce your daily sodium intake. Most people with hypertension should eat less than 1,500 mg of sodium a day.   Lifestyle  Work with your health care provider to maintain a  healthy body weight or to lose weight. Ask what an ideal weight is for you.  Get at least 30 minutes of exercise that causes your heart to beat faster (aerobic exercise) most days of the week. Activities may include walking, swimming, or biking.  Include exercise to strengthen your muscles  (resistance exercise), such as weight lifting, as part of your weekly exercise routine. Try to do these types of exercises for 30 minutes at least 3 days a week.  Do not use any products that contain nicotine or tobacco, such as cigarettes, e-cigarettes, and chewing tobacco. If you need help quitting, ask your health care provider.  Control any long-term (chronic) conditions you have, such as high cholesterol or diabetes.  Identify your sources of stress and find ways to manage stress. This may include meditation, deep breathing, or making time for fun activities.   Alcohol use  Do not drink alcohol if: ? Your health care provider tells you not to drink. ? You are pregnant, may be pregnant, or are planning to become pregnant.  If you drink alcohol: ? Limit how much you use to:  0-1 drink a day for women.  0-2 drinks a day for men. ? Be aware of how much alcohol is in your drink. In the [place] one drink equals one 12 oz bottle of beer (355 mL), one 5 oz glass of wine (148 mL), or one 1 oz glass of hard liquor (44 mL). Medicines Your health care provider may prescribe medicine if lifestyle changes are not enough to get your blood pressure under control and if:  Your systolic blood pressure is 130 or higher.  Your diastolic blood pressure is 80 or higher. Take medicines only as told by your health care provider. Follow the directions carefully. Blood pressure medicines must be taken as told by your health care provider. The medicine does not work as well when you skip doses. Skipping doses also puts you at risk for problems. Monitoring Before you monitor your blood pressure:  Do not smoke, drink caffeinated beverages, or exercise within 30 minutes before taking a measurement.  Use the bathroom and empty your bladder (urinate).  Sit quietly for at least 5 minutes before taking measurements. Monitor your blood pressure at home as told by your health care provider. To do this:  Sit  with your back straight and supported.  Place your feet flat on the floor. Do not cross your legs.  Support your arm on a flat surface, such as a table. Make sure your upper arm is at heart level.  Each time you measure, take two or three readings one minute apart and record the results. You may also need to have your blood pressure checked regularly by your health care provider.   General information  Talk with your health care provider about your diet, exercise habits, and other lifestyle factors that may be contributing to hypertension.  Review all the medicines you take with your health care provider because there may be side effects or interactions.  Keep all visits as told by your health care provider. Your health care provider can help you create and adjust your plan for managing your high blood pressure. Where to find more information  National Heart, Lung, and Blood Institute: https://wilson-eaton.com/  American Heart Association: www.heart.org Contact a health care provider if:  You think you are having a reaction to medicines you have taken.  You have repeated (recurrent) headaches.  You feel dizzy.  You have swelling in your  ankles.  You have trouble with your vision. Get help right away if:  You develop a severe headache or confusion.  You have unusual weakness or numbness, or you feel faint.  You have severe pain in your chest or abdomen.  You vomit repeatedly.  You have trouble breathing. These symptoms may represent a serious problem that is an emergency. Do not wait to see if the symptoms will go away. Get medical help right away. Call your local emergency services (911 in the [place] Do not drive yourself to the hospital. Summary  Hypertension is when the force of blood pumping through your arteries is too strong. If this condition is not controlled, it may put you at risk for serious complications.  Your personal target blood pressure may vary depending on  your medical conditions, your age, and other factors. For most people, a normal blood pressure is less than 120/80.  Hypertension is managed by lifestyle changes, medicines, or both.  Lifestyle changes to help manage hypertension include losing weight, eating a healthy, low-sodium diet, exercising more, stopping smoking, and limiting alcohol. This information is not intended to replace advice given to you by your health care provider. Make sure you discuss any questions you have with your health care provider. Document Revised: 11/06/2019 Document Reviewed: 09/01/2019 Elsevier Patient Education  2021 [employer].   If you have lab work done today you will be contacted with your lab results within the next 2 weeks.  If you have not heard from [place] then please contact us. The fastest way to get your results is to register for My Chart.   IF you received an x-ray today, you will receive an invoice from [facility]. Please contact [facility] at [phone] with questions or concerns regarding your invoice.   IF you received labwork today, you will receive an invoice from [place]. Please contact [facility] at [phone] with questions or concerns regarding your invoice.   Our billing staff will not be able to assist you with questions regarding bills from these companies.  You will be contacted with the lab results as soon as they are available. The fastest way to get your results is to activate your My Chart account. Instructions are located on the last page of this paperwork. If you have not heard from [place] regarding the results in 2 weeks, please contact this office.

## 2020-11-30 DIAGNOSIS — M170 Bilateral primary osteoarthritis of knee: Secondary | ICD-10-CM | POA: Diagnosis not present

## 2021-01-23 ENCOUNTER — Other Ambulatory Visit: Payer: Self-pay | Admitting: Family Medicine

## 2021-01-23 ENCOUNTER — Other Ambulatory Visit: Payer: Self-pay

## 2021-01-23 DIAGNOSIS — Z1231 Encounter for screening mammogram for malignant neoplasm of breast: Secondary | ICD-10-CM

## 2021-01-24 ENCOUNTER — Other Ambulatory Visit: Payer: Self-pay

## 2021-01-24 ENCOUNTER — Ambulatory Visit
Admission: RE | Admit: 2021-01-24 | Discharge: 2021-01-24 | Disposition: A | Discharge status: Home / Self Care | Payer: Medicare HMO | Source: Ambulatory Visit | Attending: Family Medicine | Admitting: Family Medicine

## 2021-01-24 DIAGNOSIS — Z1231 Encounter for screening mammogram for malignant neoplasm of breast: Secondary | ICD-10-CM

## 2021-03-01 ENCOUNTER — Ambulatory Visit (INDEPENDENT_AMBULATORY_CARE_PROVIDER_SITE_OTHER): Payer: Medicare HMO | Admitting: Family Medicine

## 2021-03-01 ENCOUNTER — Encounter: Payer: Self-pay | Admitting: Family Medicine

## 2021-03-01 VITALS — BP 126/74 | HR 75 | Temp 98.1°F | Resp 15 | Ht 61.0 in | Wt 133.2 lb

## 2021-03-01 DIAGNOSIS — I10 Essential (primary) hypertension: Secondary | ICD-10-CM

## 2021-03-01 DIAGNOSIS — R059 Cough, unspecified: Secondary | ICD-10-CM | POA: Diagnosis not present

## 2021-03-01 DIAGNOSIS — G8929 Other chronic pain: Secondary | ICD-10-CM | POA: Diagnosis not present

## 2021-03-01 DIAGNOSIS — R7303 Prediabetes: Secondary | ICD-10-CM | POA: Diagnosis not present

## 2021-03-01 DIAGNOSIS — M545 Low back pain, unspecified: Secondary | ICD-10-CM | POA: Diagnosis not present

## 2021-03-01 LAB — COMPREHENSIVE METABOLIC PANEL
ALT: 15 U/L (ref 0–35)
AST: 21 U/L (ref 0–37)
Albumin: 4.5 g/dL (ref 3.5–5.2)
Alkaline Phosphatase: 84 U/L (ref 39–117)
BUN: 21 mg/dL (ref 6–23)
CO2: 29 mEq/L (ref 19–32)
Calcium: 9.7 mg/dL (ref 8.4–10.5)
Chloride: 100 mEq/L (ref 96–112)
Creatinine, Ser: 0.72 mg/dL (ref 0.40–1.20)
GFR: 88.48 mL/min (ref >60.00)
Glucose, Bld: 92 mg/dL (ref 70–99)
Potassium: 3.7 mEq/L (ref 3.5–5.1)
Sodium: 137 mEq/L (ref 135–145)
Total Bilirubin: 1.2 mg/dL (ref 0.2–1.2)
Total Protein: 8.2 g/dL (ref 6.0–8.3)

## 2021-03-01 LAB — HEMOGLOBIN A1C: Hgb A1c MFr Bld: 6.3 % (ref 4.6–6.5)

## 2021-03-01 MED ORDER — IRBESARTAN-HYDROCHLOROTHIAZIDE 300-12.5 MG PO TABS
1.0000 | ORAL_TABLET | Freq: Every day | ORAL | 2 refills | Status: DC
Start: 2021-03-01 — End: 2021-10-11

## 2021-03-01 NOTE — Progress Notes (Signed)
Subjective:  Patient ID: [name], female    DOB: 1956-07-01  Age: 65 y.o. MRN: [id number]  CC:  Chief Complaint  Patient presents with  . Prediabetes    Pt doing okay wanted to recheck levels to ensure numbers okay with current diet, pt reports checks BG occasionally. Pt is fasting today for lab work  . Hypertension    Pt has been taking medication, denies physical symptoms     HPI [name] presents for   Hypertension: Irbesartan hydrochlorothiazide 300/12.5 mg daily. No new side effects with meds.  Home readings: stable- normal range. 120/70 range.  Only 1 day lightheaded- did not eat well that day. No recurrence.  Back sore at times at night. No bowel or bladder incontinence, no saddle anesthesia, no lower extremity weakness. Chronic pain, no recent injury. No changes.  No meds. Looked at adjustable bed. Not bothering during day. Occasional tylenol. Not daily sx's.  BP Readings from Last 3 Encounters:  03/01/21 126/74  11/28/20 138/83  10/20/20 134/80   Lab Results  Component Value Date   CREATININE 0.72 03/01/2021   Prediabetes: No current meds, home readings: low 100's.  Exercise - joined gym, walking. Diet - no soda/sweet tea. Drinks water.  Lab Results  Component Value Date   HGBA1C 6.3 03/01/2021   Wt Readings from Last 3 Encounters:  03/01/21 133 lb 3.2 oz (60.4 kg)  11/28/20 134 lb 12.8 oz (61.1 kg)  10/20/20 135 lb (61.2 kg)   Cough Discussed at February visit, possible postnasal drip.  Fluticasone discussed at night with RTC precautions.  Cough is better. Using flonase but not every night.   History Patient Active Problem List   Diagnosis Date Noted  . Encounter for Medicare annual wellness exam 04/30/2016  . Prediabetes 03/11/2014  . Hyperlipidemia   . Hypertension   . Vitamin D deficiency   . TBI (traumatic brain injury) [facility])    Past Medical History:  Diagnosis Date  . Arthritis    L knee  . Hyperlipidemia   . Hypertension    . Prediabetes   . Seizures ([place])   . TBI (traumatic brain injury) [facility])    Memory issues   . Vitamin D deficiency    Past Surgical History:  Procedure Laterality Date  . ABDOMINAL HYSTERECTOMY     BSO  . BREAST BIOPSY Left   . KNEE ARTHROSCOPY Right    No Known Allergies Prior to Admission medications   Medication Sig Start Date End Date Taking? Authorizing Provider  ACCU-CHEK AVIVA PLUS test strip Check readings once per week. 11/28/20  Yes Wendie Agreste, MD  Accu-Chek Softclix Lancets lancets Check reading once per week, 11/28/20  Yes Wendie Agreste, MD  Alcohol Swabs (B-D SINGLE USE SWABS REGULAR) PADS  12/18/19  Yes [provider]  Blood Glucose Monitoring Suppl (ACCU-CHEK AVIVA PLUS) w/Device KIT  12/18/19  Yes [provider]  Cholecalciferol (VITAMIN D PO) Take 5,000 Int'l Units by mouth daily.   Yes [provider]  fluticasone (FLONASE) 50 MCG/ACT nasal spray Place 1 spray into both nostrils daily. 11/28/20  Yes Wendie Agreste, MD  irbesartan-hydrochlorothiazide (AVALIDE) 300-12.5 MG tablet Take 1 tablet by mouth daily. 11/28/20  Yes Wendie Agreste, MD   Social History   Socioeconomic History  . Marital status: Divorced    Spouse name: Not on file  . Number of children: Not on file  . Years of education: Not on file  . Highest  education level: Not on file  Occupational History  . Not on file  Tobacco Use  . Smoking status: Never Smoker  . Smokeless tobacco: Never Used  Vaping Use  . Vaping Use: Never used  Substance and Sexual Activity  . Alcohol use: No  . Drug use: No  . Sexual activity: Not on file  Other Topics Concern  . Not on file  Social History Narrative  . Not on file   Social Determinants of Health   Financial Resource Strain: Not on file  Food Insecurity: Not on file  Transportation Needs: Not on file  Physical Activity: Not on file  Stress: Not on file  Social Connections: Not on file  Intimate Partner  Violence: Not on file   Review of Systems  Constitutional: Negative for fatigue and unexpected weight change.  Respiratory: Negative for chest tightness and shortness of breath.   Cardiovascular: Negative for chest pain, palpitations and leg swelling.  Gastrointestinal: Negative for abdominal pain and blood in stool.  Musculoskeletal: Positive for back pain.  Neurological: Negative for dizziness, syncope, light-headedness and headaches.    Objective:   Vitals:   03/01/21 0958  BP: 126/74  Pulse: 75  Resp: 15  Temp: 98.1 F (36.7 C)  TempSrc: Temporal  SpO2: 98%  Weight: 133 lb 3.2 oz (60.4 kg)  Height: 5' 1"  (1.549 m)    Physical Exam Vitals reviewed.  Constitutional:      Appearance: She is well-developed.  HENT:     Head: Normocephalic and atraumatic.  Eyes:     Conjunctiva/sclera: Conjunctivae normal.     Pupils: Pupils are equal, round, and reactive to light.  Neck:     Vascular: No carotid bruit.  Cardiovascular:     Rate and Rhythm: Normal rate and regular rhythm.     Heart sounds: Normal heart sounds.  Pulmonary:     Effort: Pulmonary effort is normal.     Breath sounds: Normal breath sounds.  Abdominal:     Palpations: Abdomen is soft. There is no pulsatile mass.     Tenderness: There is no abdominal tenderness.  Musculoskeletal:     Comments: Lumbar spine - no focal bony TTP. Neg seated SLR. Intact SLR testing.   Skin:    General: Skin is warm and dry.  Neurological:     General: No focal deficit present.     Mental Status: She is alert and oriented to person, place, and time.  Psychiatric:        Mood and Affect: Mood normal.        Behavior: Behavior normal.     Assessment & Plan:  [name] is a 65 y.o. female . Cough  -Improved.  Continue Flonase with option of nightly dosing.  Essential hypertension - Plan: irbesartan-hydrochlorothiazide (AVALIDE) 300-12.5 MG tablet, Comprehensive metabolic panel  -Stable with current regimen,  continue same  Prediabetes - Plan: Comprehensive metabolic panel, Hemoglobin A1c  -Check labs, watch diet, low intensity exercise with walking, resisted exercises.  Chronic low back pain without sciatica, unspecified back pain laterality  -New concern.  No red flags on history or exam.  Likely component of degenerative disc disease.  Recommended over-the-counter Tylenol as needed, heat or ice, supportive pillows/changing position with sleeping to see if that is helpful and RTC precautions if persistent or worsening.  Consider imaging.  Meds ordered this encounter  Medications  . irbesartan-hydrochlorothiazide (AVALIDE) 300-12.5 MG tablet    Sig: Take 1 tablet by mouth daily.  Dispense:  90 tablet    Refill:  2    Ok to place on hold   Patient Instructions   No med changes at this time.  See info on back pain. Tylenol as needed, try placing pillows for more comfort at night. If back pain is not improving, then please follow up to discuss further with a medical provider.   Return to the clinic or go to the nearest emergency room if any of your symptoms worsen or new symptoms occur.   Chronic Back Pain When back pain lasts longer than 3 months, it is called chronic back pain. Pain may get worse at certain times (flare-ups). There are things you can do at home to manage your pain. Follow these instructions at home: Pay attention to any changes in your symptoms. Take these actions to help with your pain: Managing pain and stiffness  If told, put ice on the painful area. Your doctor may tell you to use ice for 24-48 hours after the flare-up starts. To do this: ? Put ice in a plastic bag. ? Place a towel between your skin and the bag. ? Leave the ice on for 20 minutes, 2-3 times a day.  If told, put heat on the painful area. Do this as often as told by your doctor. Use the heat source that your doctor recommends, such as a moist heat pack or a heating pad. ? Place a towel between your  skin and the heat source. ? Leave the heat on for 20-30 minutes. ? Take off the heat if your skin turns bright red. This is especially important if you are unable to feel pain, heat, or cold. You may have a greater risk of getting burned.  Soak in a warm bath. This can help relieve pain.      Activity  Avoid bending and other activities that make pain worse.  When standing: ? Keep your upper back and neck straight. ? Keep your shoulders pulled back. ? Avoid slouching.  When sitting: ? Keep your back straight. ? Relax your shoulders. Do not round your shoulders or pull them backward.  Do not sit or stand in one place for long periods of time.  Take short rest breaks during the day. Lying down or standing is usually better than sitting. Resting can help relieve pain.  When sitting or lying down for a long time, do some mild activity or stretching. This will help to prevent stiffness and pain.  Get regular exercise. Ask your doctor what activities are safe for you.  Do not lift anything that is heavier than 10 lb (4.5 kg) or the limit that you are told, until your doctor says that it is safe.  To prevent injury when you lift things: ? Bend your knees. ? Keep the weight close to your body. ? Avoid twisting.  Sleep on a firm mattress. Try lying on your side with your knees slightly bent. If you lie on your back, put a pillow under your knees.   Medicines  Treatment may include medicines for pain and swelling taken by mouth or put on the skin, prescription pain medicine, or muscle relaxants.  Take over-the-counter and prescription medicines only as told by your doctor.  Ask your doctor if the medicine prescribed to you: ? Requires you to avoid driving or using machinery. ? Can cause trouble pooping (constipation). You may need to take these actions to prevent or treat trouble pooping:  Drink enough fluid to keep your  pee (urine) pale yellow.  Take over-the-counter or  prescription medicines.  Eat foods that are high in fiber. These include beans, whole grains, and fresh fruits and vegetables.  Limit foods that are high in fat and sugars. These include fried or sweet foods. General instructions  Do not use any products that contain nicotine or tobacco, such as cigarettes, e-cigarettes, and chewing tobacco. If you need help quitting, ask your doctor.  Keep all follow-up visits as told by your doctor. This is important. Contact a doctor if:  Your pain does not get better with rest or medicine.  Your pain gets worse, or you have new pain.  You have a high fever.  You lose weight very quickly.  You have trouble doing your normal activities. Get help right away if:  One or both of your legs or feet feel weak.  One or both of your legs or feet lose feeling (have numbness).  You have trouble controlling when you poop (have a bowel movement) or pee (urinate).  You have bad back pain and: ? You feel like you may vomit (nauseous), or you vomit. ? You have pain in your belly (abdomen). ? You have shortness of breath. ? You faint. Summary  When back pain lasts longer than 3 months, it is called chronic back pain.  Pain may get worse at certain times (flare-ups).  Use ice and heat as told by your doctor. Your doctor may tell you to use ice after flare-ups. This information is not intended to replace advice given to you by your health care provider. Make sure you discuss any questions you have with your health care provider. Document Revised: 11/11/2019 Document Reviewed: 11/11/2019 Elsevier Patient Education  2021 [place].      Signed, Merri Ray, MD [facility]

## 2021-03-01 NOTE — Patient Instructions (Signed)
No med changes at this time.  See info on back pain. Tylenol as needed, try placing pillows for more comfort at night. If back pain is not improving, then please follow up to discuss further with a medical provider.   Return to the clinic or go to the nearest emergency room if any of your symptoms worsen or new symptoms occur.   Chronic Back Pain When back pain lasts longer than 3 months, it is called chronic back pain. Pain may get worse at certain times (flare-ups). There are things you can do at home to manage your pain. Follow these instructions at home: Pay attention to any changes in your symptoms. Take these actions to help with your pain: Managing pain and stiffness  If told, put ice on the painful area. Your doctor may tell you to use ice for 24-48 hours after the flare-up starts. To do this: ? Put ice in a plastic bag. ? Place a towel between your skin and the bag. ? Leave the ice on for 20 minutes, 2-3 times a day.  If told, put heat on the painful area. Do this as often as told by your doctor. Use the heat source that your doctor recommends, such as a moist heat pack or a heating pad. ? Place a towel between your skin and the heat source. ? Leave the heat on for 20-30 minutes. ? Take off the heat if your skin turns bright red. This is especially important if you are unable to feel pain, heat, or cold. You may have a greater risk of getting burned.  Soak in a warm bath. This can help relieve pain.      Activity  Avoid bending and other activities that make pain worse.  When standing: ? Keep your upper back and neck straight. ? Keep your shoulders pulled back. ? Avoid slouching.  When sitting: ? Keep your back straight. ? Relax your shoulders. Do not round your shoulders or pull them backward.  Do not sit or stand in one place for long periods of time.  Take short rest breaks during the day. Lying down or standing is usually better than sitting. Resting can help  relieve pain.  When sitting or lying down for a long time, do some mild activity or stretching. This will help to prevent stiffness and pain.  Get regular exercise. Ask your doctor what activities are safe for you.  Do not lift anything that is heavier than 10 lb (4.5 kg) or the limit that you are told, until your doctor says that it is safe.  To prevent injury when you lift things: ? Bend your knees. ? Keep the weight close to your body. ? Avoid twisting.  Sleep on a firm mattress. Try lying on your side with your knees slightly bent. If you lie on your back, put a pillow under your knees.   Medicines  Treatment may include medicines for pain and swelling taken by mouth or put on the skin, prescription pain medicine, or muscle relaxants.  Take over-the-counter and prescription medicines only as told by your doctor.  Ask your doctor if the medicine prescribed to you: ? Requires you to avoid driving or using machinery. ? Can cause trouble pooping (constipation). You may need to take these actions to prevent or treat trouble pooping:  Drink enough fluid to keep your pee (urine) pale yellow.  Take over-the-counter or prescription medicines.  Eat foods that are high in fiber. These include beans, whole grains, and  fresh fruits and vegetables.  Limit foods that are high in fat and sugars. These include fried or sweet foods. General instructions  Do not use any products that contain nicotine or tobacco, such as cigarettes, e-cigarettes, and chewing tobacco. If you need help quitting, ask your doctor.  Keep all follow-up visits as told by your doctor. This is important. Contact a doctor if:  Your pain does not get better with rest or medicine.  Your pain gets worse, or you have new pain.  You have a high fever.  You lose weight very quickly.  You have trouble doing your normal activities. Get help right away if:  One or both of your legs or feet feel weak.  One or both of  your legs or feet lose feeling (have numbness).  You have trouble controlling when you poop (have a bowel movement) or pee (urinate).  You have bad back pain and: ? You feel like you may vomit (nauseous), or you vomit. ? You have pain in your belly (abdomen). ? You have shortness of breath. ? You faint. Summary  When back pain lasts longer than 3 months, it is called chronic back pain.  Pain may get worse at certain times (flare-ups).  Use ice and heat as told by your doctor. Your doctor may tell you to use ice after flare-ups. This information is not intended to replace advice given to you by your health care provider. Make sure you discuss any questions you have with your health care provider. Document Revised: 11/11/2019 Document Reviewed: 11/11/2019 Elsevier Patient Education  2021 [employer].

## 2021-07-05 IMAGING — MG DIGITAL SCREENING BILAT W/ TOMO W/ CAD
8 series · 8 of 24 positions shown · non-contrast
Comparison: Previous exam(s).

CLINICAL DATA: Screening.

EXAM:
DIGITAL SCREENING BILATERAL MAMMOGRAM WITH TOMO AND CAD

[L CC synth-2D]
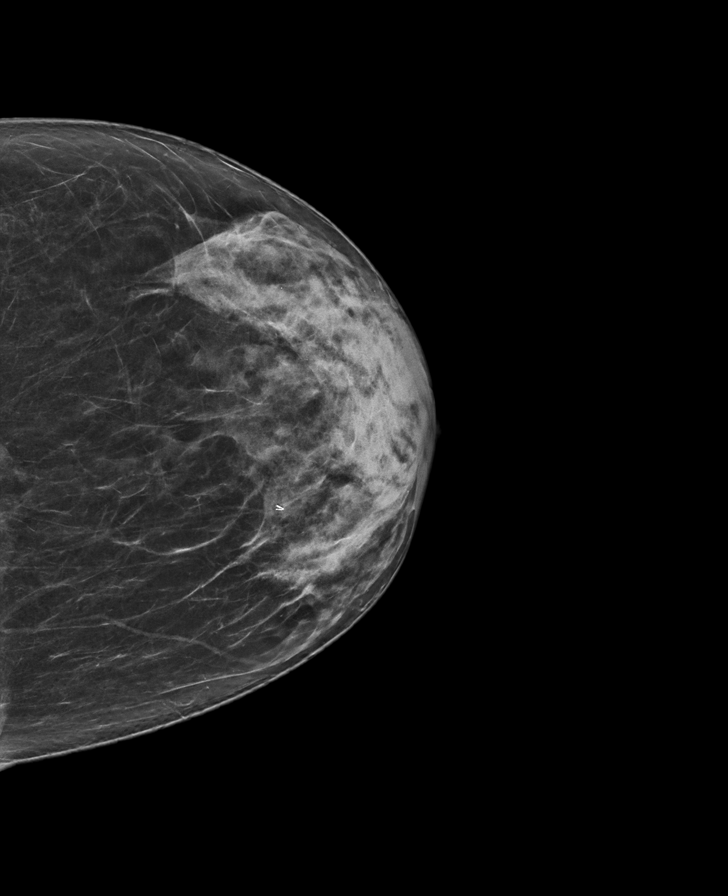

[R MLO synth-2D]
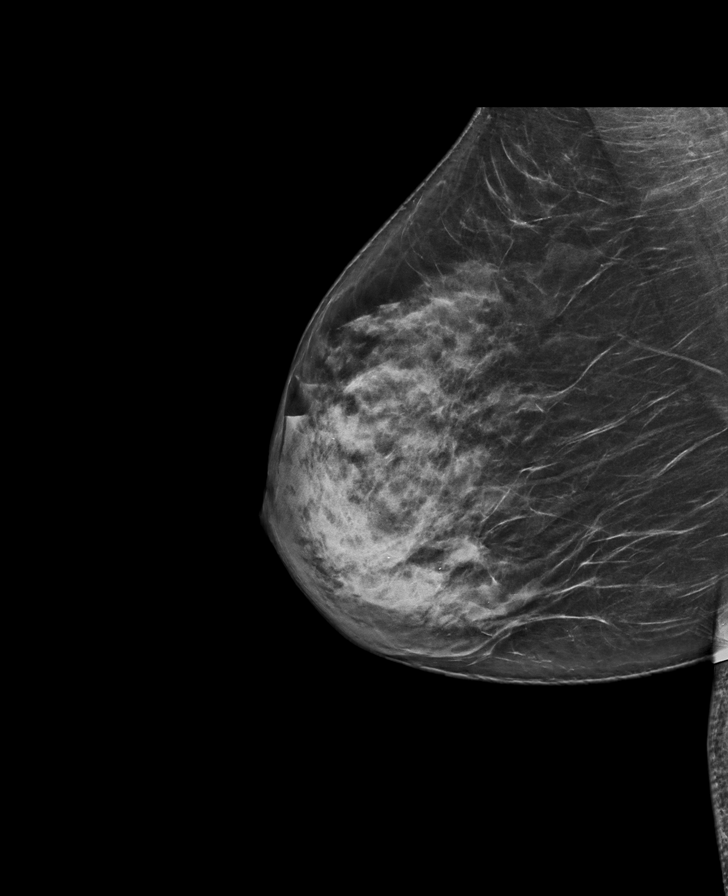

[L MLO synth-2D]
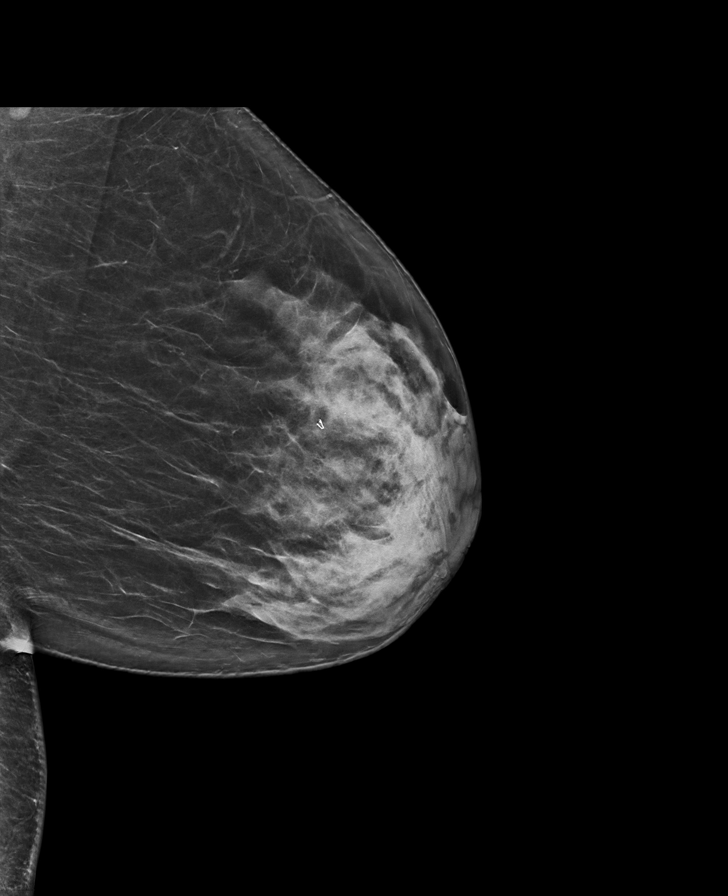

[R CC synth-2D]
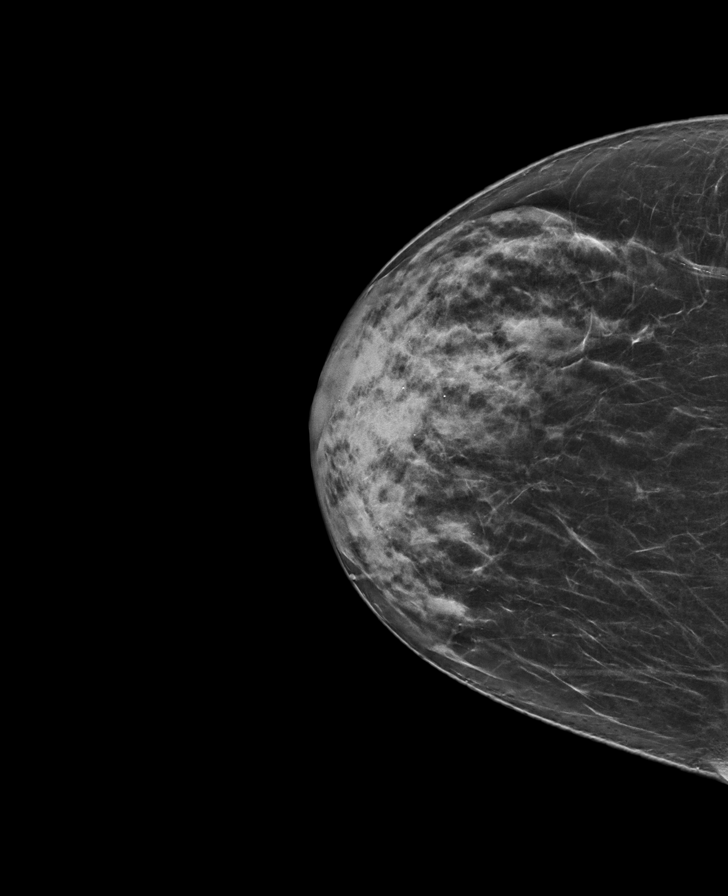

[R CC tomo · tomo slice 35/70.0]
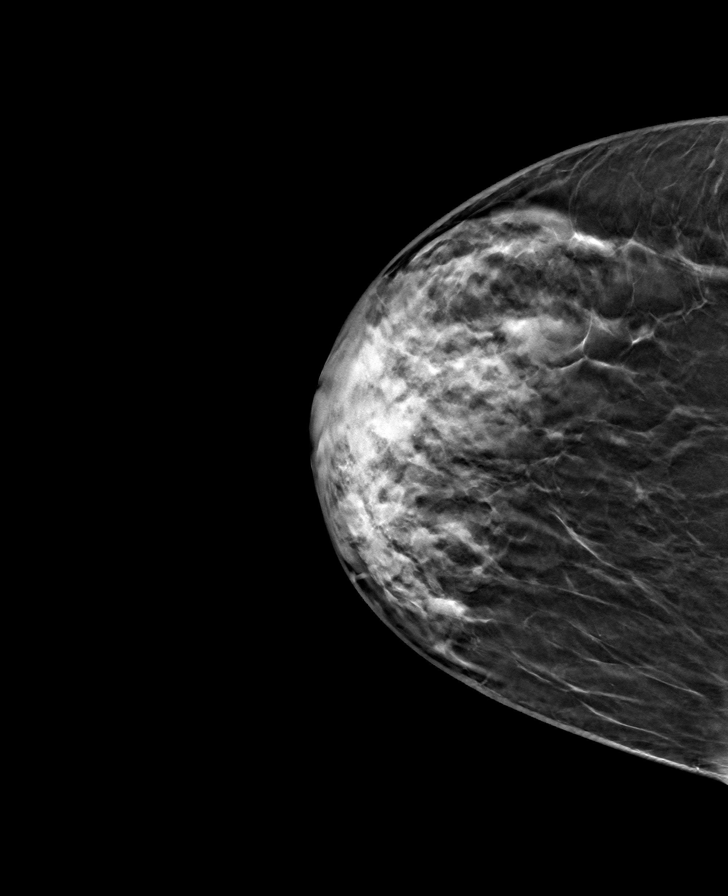

[R MLO tomo · tomo slice 39/76.0]
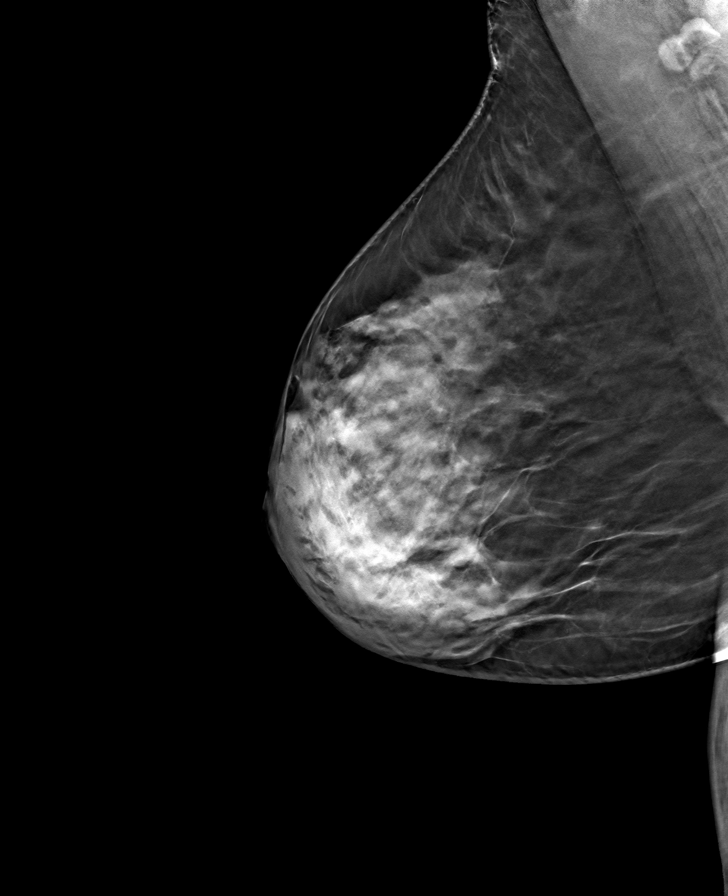

[L CC tomo · tomo slice 37/72.0]
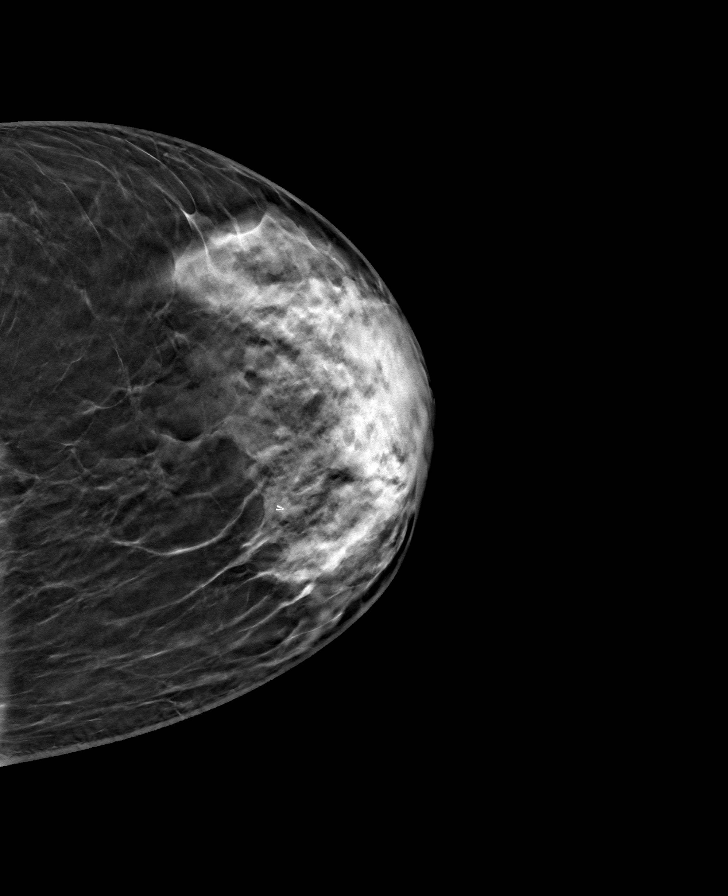

[L MLO tomo · tomo slice 41/80.0]
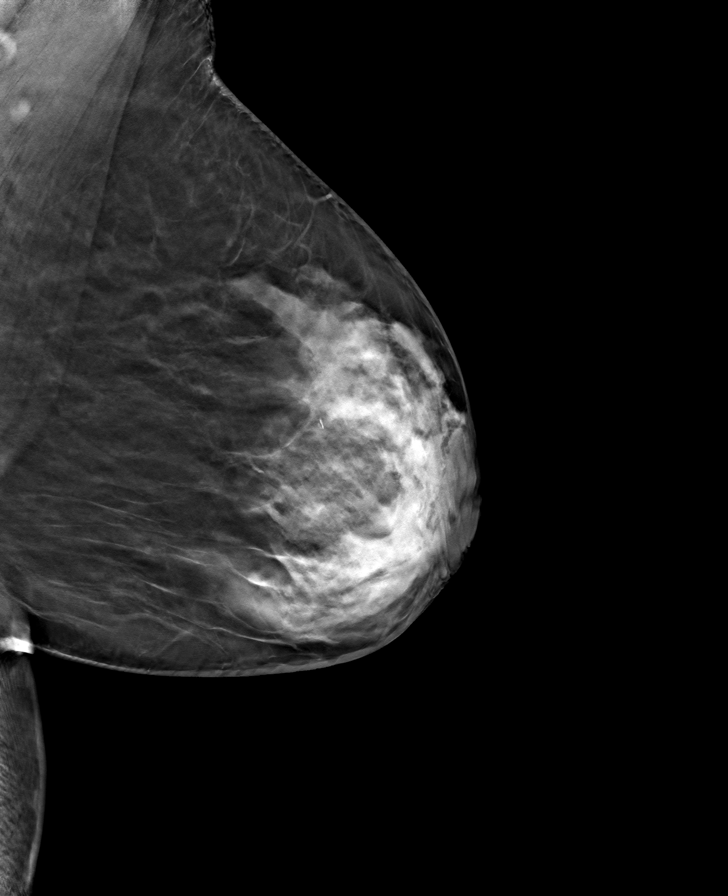

[8 of 24 positions shown; findings below may reference images not displayed]

ACR Breast Density Category c: The breast tissue is heterogeneously
dense, which may obscure small masses.
FINDINGS: There are no findings suspicious for malignancy. Images were
processed with CAD.
IMPRESSION: No mammographic evidence of malignancy. A result letter of this
screening mammogram will be mailed directly to the patient.

RECOMMENDATION:
Screening mammogram in one year. (Code:[id number])

BI-RADS CATEGORY  1: Negative.

## 2021-09-01 ENCOUNTER — Ambulatory Visit: Payer: Medicare HMO | Admitting: Family Medicine

## 2021-09-05 ENCOUNTER — Other Ambulatory Visit (HOSPITAL_COMMUNITY): Payer: Self-pay

## 2021-09-20 ENCOUNTER — Ambulatory Visit: Payer: Medicare HMO | Admitting: Family Medicine

## 2021-10-11 ENCOUNTER — Ambulatory Visit (INDEPENDENT_AMBULATORY_CARE_PROVIDER_SITE_OTHER): Payer: Medicare HMO | Admitting: Family Medicine

## 2021-10-11 VITALS — BP 128/76 | HR 77 | Temp 98.3°F | Resp 17 | Ht 61.0 in | Wt 136.2 lb

## 2021-10-11 DIAGNOSIS — I10 Essential (primary) hypertension: Secondary | ICD-10-CM | POA: Diagnosis not present

## 2021-10-11 DIAGNOSIS — R350 Frequency of micturition: Secondary | ICD-10-CM | POA: Diagnosis not present

## 2021-10-11 DIAGNOSIS — R7303 Prediabetes: Secondary | ICD-10-CM | POA: Diagnosis not present

## 2021-10-11 DIAGNOSIS — Z23 Encounter for immunization: Secondary | ICD-10-CM

## 2021-10-11 MED ORDER — IRBESARTAN 300 MG PO TABS: 300.0000 mg | ORAL_TABLET | Freq: Every day | ORAL | 1 refills | Status: DC

## 2021-10-11 MED ORDER — AMLODIPINE BESYLATE 2.5 MG PO TABS: 2.5000 mg | ORAL_TABLET | Freq: Every day | ORAL | 1 refills | Status: DC

## 2021-10-11 NOTE — Progress Notes (Signed)
Subjective:  Patient ID: [name], female    DOB: [birth year]  Age: 65 y.o. MRN: [id number]  CC:  Chief Complaint  Patient presents with   Prediabetes    Pt here for recheck, no concerns per pt    Hypertension    Here for recheck no concerns denies physical sxs   Immunizations    Pt due for shingels, flu and tetanus would like to be advised what to take for best adherence     HPI [name] presents for   Prediabetes: Had joined the gym and walking when discussed at her May 18 visit.  A1c similar to previous levels, 6.2 in November 2021.  Weight up a few pounds since May. Home housework, some home exercises with leg movements, walking at store. Some walk in neighborhood during the summer.  Soda/sweet tea -rare sweet tea over the holidays.usually water. No new thirst/urinary frequency.  Lab Results  Component Value Date   HGBA1C 6.3 03/01/2021   Wt Readings from Last 3 Encounters:  10/11/21 136 lb 3.2 oz (61.8 kg)  03/01/21 133 lb 3.2 oz (60.4 kg)  11/28/20 134 lb 12.8 oz (61.1 kg)   Hypertension: Avalide 300/12.5 mg daily No new med side effects, does have to urinate frequently but does drink a lot of water.  Home readings: 120range/60-70 range.  Option of new med discussed with frequent urination.  No new pain, no meds needed for back pain - sleeps on side - doing ok.  Minimal HA last night, today, not worst HA, improved then recurred. Due for updated eye exam. Rare arthralgias in legs - resolves with activity.  BP Readings from Last 3 Encounters:  10/11/21 128/76  03/01/21 126/74  11/28/20 138/83   Lab Results  Component Value Date   CREATININE 0.72 03/01/2021   Hyperlipidemia: Hyperlipidemia by history, most recent Labs have been normal in November 2021. Lab Results  Component Value Date   CHOL 170 09/12/2020   HDL 67 09/12/2020   LDLCALC 87 09/12/2020   TRIG 90 09/12/2020   CHOLHDL 2.5 09/12/2020   Lab Results  Component Value Date   ALT 15  03/01/2021   AST 21 03/01/2021   ALKPHOS 84 03/01/2021   BILITOT 1.2 03/01/2021   vitamin D Currently on over-the-counter supplementation. Unknown dose.  Lab Results  Component Value Date   VD25OH 58.5 02/04/2020      History Patient Active Problem List   Diagnosis Date Noted   Encounter for Medicare annual wellness exam 04/30/2016   Prediabetes 03/11/2014   Hyperlipidemia    Hypertension    Vitamin D deficiency    TBI (traumatic brain injury)    Past Medical History:  Diagnosis Date   Arthritis    L knee   Hyperlipidemia    Hypertension    Prediabetes    Seizures ([place])    TBI (traumatic brain injury) ([place])    Memory issues    Vitamin D deficiency    Past Surgical History:  Procedure Laterality Date   ABDOMINAL HYSTERECTOMY     BSO   BREAST BIOPSY Left    KNEE ARTHROSCOPY Right    No Known Allergies Prior to Admission medications   Medication Sig Start Date End Date Taking? Authorizing Provider  ACCU-CHEK AVIVA PLUS test strip Check readings once per week. 11/28/20  Yes Wendie Agreste, MD  Accu-Chek Softclix Lancets lancets Check reading once per week, 11/28/20  Yes Wendie Agreste, MD  Alcohol Swabs (B-D SINGLE  USE SWABS REGULAR) PADS  12/18/19  Yes [provider]  Blood Glucose Monitoring Suppl (ACCU-CHEK AVIVA PLUS) w/Device KIT  12/18/19  Yes [provider]  Cholecalciferol (VITAMIN D PO) Take 5,000 Int'l Units by mouth daily.   Yes [provider]  fluticasone (FLONASE) 50 MCG/ACT nasal spray Place 1 spray into both nostrils daily. 11/28/20  Yes Wendie Agreste, MD  irbesartan-hydrochlorothiazide (AVALIDE) 300-12.5 MG tablet Take 1 tablet by mouth daily. 03/01/21  Yes Wendie Agreste, MD   Social History   Socioeconomic History   Marital status: Divorced    Spouse name: Not on file   Number of children: Not on file   Years of education: Not on file   Highest education level: Not on file  Occupational History   Not on  file  Tobacco Use   Smoking status: Never   Smokeless tobacco: Never  Vaping Use   Vaping Use: Never used  Substance and Sexual Activity   Alcohol use: No   Drug use: No   Sexual activity: Not on file  Other Topics Concern   Not on file  Social History Narrative   Not on file   Social Determinants of Health   Financial Resource Strain: Not on file  Food Insecurity: Not on file  Transportation Needs: Not on file  Physical Activity: Not on file  Stress: Not on file  Social Connections: Not on file  Intimate Partner Violence: Not on file    Review of Systems  Constitutional:  Negative for fatigue and unexpected weight change.  Respiratory:  Negative for chest tightness and shortness of breath.   Cardiovascular:  Negative for chest pain, palpitations and leg swelling.  Gastrointestinal:  Negative for abdominal pain and blood in stool.  Neurological:  Positive for headaches (minimal.). Negative for dizziness, syncope and light-headedness.    Objective:   Vitals:   10/11/21 1438  BP: 128/76  Pulse: 77  Resp: 17  Temp: 98.3 F (36.8 C)  TempSrc: Temporal  SpO2: 98%  Weight: 136 lb 3.2 oz (61.8 kg)  Height: 5' 1" (1.549 m)     Physical Exam Vitals reviewed.  Constitutional:      Appearance: Normal appearance. She is well-developed.  HENT:     Head: Normocephalic and atraumatic.  Eyes:     Conjunctiva/sclera: Conjunctivae normal.     Pupils: Pupils are equal, round, and reactive to light.  Neck:     Vascular: No carotid bruit.  Cardiovascular:     Rate and Rhythm: Normal rate and regular rhythm.     Heart sounds: Normal heart sounds.  Pulmonary:     Effort: Pulmonary effort is normal.     Breath sounds: Normal breath sounds.  Abdominal:     Palpations: Abdomen is soft. There is no pulsatile mass.     Tenderness: There is no abdominal tenderness.  Musculoskeletal:     Right lower leg: No edema.     Left lower leg: No edema.  Skin:    General: Skin is  warm and dry.  Neurological:     General: No focal deficit present.     Mental Status: She is alert and oriented to person, place, and time.     GCS: GCS eye subscore is 4. GCS verbal subscore is 5. GCS motor subscore is 6.     Cranial Nerves: No dysarthria or facial asymmetry.     Motor: No weakness.     Coordination: Coordination is intact.  Gait: Gait is intact.  Psychiatric:        Mood and Affect: Mood normal.        Behavior: Behavior normal.       Assessment & Plan:  [name] is a 65 y.o. female . Essential hypertension - Plan: Comprehensive metabolic panel, irbesartan (AVAPRO) 300 MG tablet, amLODipine (NORVASC) 2.5 MG tablet  -Overall stable control but does have frequent urination.  Could be related to the HCTZ and, the medication.  Options discussed.  She would like to try change temporarily.  Continue same dose irbesartan.  Stop HCTZ, add amlodipine 2.5 mg daily.  Potential side effects discussed.  Home monitoring with RTC precautions discussed.  Recheck 3 weeks.  Check labs above.  -Minimal headache, intermittent.  Nonfocal exam.  Blood pressure stable.  RTC precautions if persistent or new/worsening symptoms.  Prediabetes - Plan: Comprehensive metabolic panel, Hemoglobin A1c  -Continue to avoid sugar containing beverages, activity discussed, check A1c.  Frequent urination - Plan: Comprehensive metabolic panel  -New concern, potentially could be related to her frequent fluid intake versus the HCTZ versus less likely hyperglycemia.  Check A1c, CMP as above.  Changed from HCTZ to amlodipine.  Recheck few weeks  Needs flu shot - Plan: Flu Vaccine QUAD 6+ mos PF IM (Fluarix Quad PF)   Meds ordered this encounter  Medications   irbesartan (AVAPRO) 300 MG tablet    Sig: Take 1 tablet (300 mg total) by mouth daily.    Dispense:  90 tablet    Refill:  1   amLODipine (NORVASC) 2.5 MG tablet    Sig: Take 1 tablet (2.5 mg total) by mouth daily.    Dispense:  90  tablet    Refill:  1   Patient Instructions  One of the medications in the combination blood pressure pill may be causing some of the frequent urination.  Stop the current blood pressure pill.   Start irbesartan 300 mg once per day (this was in your combination pill) and the new medication amlodipine 2.5 mg once per day.  Check some blood pressure readings at home once per day. Bring those to next visit.  If readings over 160/100 or you have any lightheadedness, dizziness or low blood pressure readings follow-up be seen right way.   Otherwise I will see you back in a few weeks and we can see how that medication is working. Return to the clinic or go to the nearest emergency room if any of your symptoms worsen or new symptoms occur.       Signed,   Merri Ray, MD [place], [facility] 10/11/21 3:18 PM

## 2021-10-11 NOTE — Patient Instructions (Addendum)
One of the medications in the combination blood pressure pill may be causing some of the frequent urination.  Stop the current blood pressure pill.   Start irbesartan 300 mg once per day (this was in your combination pill) and the new medication amlodipine 2.5 mg once per day.  Check some blood pressure readings at home once per day. Bring those to next visit.  If readings over 160/100 or you have any lightheadedness, dizziness or low blood pressure readings follow-up be seen right way.   Otherwise I will see you back in a few weeks and we can see how that medication is working. Return to the clinic or go to the nearest emergency room if any of your symptoms worsen or new symptoms occur.

## 2021-10-12 LAB — COMPREHENSIVE METABOLIC PANEL
ALT: 16 U/L (ref 0–35)
AST: 17 U/L (ref 0–37)
Albumin: 4.4 g/dL (ref 3.5–5.2)
Alkaline Phosphatase: 92 U/L (ref 39–117)
BUN: 18 mg/dL (ref 6–23)
CO2: 31 mEq/L (ref 19–32)
Calcium: 9.7 mg/dL (ref 8.4–10.5)
Chloride: 100 mEq/L (ref 96–112)
Creatinine, Ser: 0.72 mg/dL (ref 0.40–1.20)
GFR: 88.1 mL/min (ref >60.00)
Glucose, Bld: 83 mg/dL (ref 70–99)
Potassium: 3.7 mEq/L (ref 3.5–5.1)
Sodium: 138 mEq/L (ref 135–145)
Total Bilirubin: 0.8 mg/dL (ref 0.2–1.2)
Total Protein: 7.9 g/dL (ref 6.0–8.3)

## 2021-10-12 LAB — HEMOGLOBIN A1C: Hgb A1c MFr Bld: 6.5 % (ref 4.6–6.5)

## 2021-11-01 ENCOUNTER — Ambulatory Visit (INDEPENDENT_AMBULATORY_CARE_PROVIDER_SITE_OTHER): Payer: Medicare PPO | Admitting: Family Medicine

## 2021-11-01 VITALS — BP 128/82 | HR 76 | Temp 98.2°F | Resp 16 | Ht 61.0 in | Wt 136.8 lb

## 2021-11-01 DIAGNOSIS — I10 Essential (primary) hypertension: Secondary | ICD-10-CM | POA: Diagnosis not present

## 2021-11-01 DIAGNOSIS — R350 Frequency of micturition: Secondary | ICD-10-CM | POA: Diagnosis not present

## 2021-11-01 NOTE — Patient Instructions (Addendum)
Glad to hear that the new blood pressure regimen has been better as far as the frequent urination.  If frequent urination returns we can look at other causes.  Okay to remain on same medication for now.  See information below on managing high blood pressure.  If blood pressure starts to run higher than 130/80 consistently we have the option of increasing amlodipine but I am okay remaining on the same dose of medication for now.  It has been a pleasure taking care of you.  Please let us know if we can help further.  I do recommend following up with your new primary care provider in the next 5 months for repeat blood work as the last prediabetes test had increased slightly.  Let us know if there are questions and take care!   Managing Your Hypertension Hypertension, also called high blood pressure, is when the force of the blood pressing against the walls of the arteries is too strong. Arteries are blood vessels that carry blood from your heart throughout your body. Hypertension forces the heart to work harder to pump blood and may cause the arteries to become narrow or stiff. Understanding blood pressure readings Your personal target blood pressure may vary depending on your medical conditions, your age, and other factors. A blood pressure reading includes a higher number over a lower number. Ideally, your blood pressure should be below 120/80. You should know that: The first, or top, number is called the systolic pressure. It is a measure of the pressure in your arteries as your heart beats. The second, or bottom number, is called the diastolic pressure. It is a measure of the pressure in your arteries as the heart relaxes. Blood pressure is classified into four stages. Based on your blood pressure reading, your health care provider may use the following stages to determine what type of treatment you need, if any. Systolic pressure and diastolic pressure are measured in a unit called  mmHg. Normal Systolic pressure: below 102. Diastolic pressure: below 80. Elevated Systolic pressure: 725-366. Diastolic pressure: below 80. Hypertension stage 1 Systolic pressure: 440-347. Diastolic pressure: 42-59. Hypertension stage 2 Systolic pressure: 563 or above. Diastolic pressure: 90 or above. How can this condition affect me? Managing your hypertension is an important responsibility. Over time, hypertension can damage the arteries and decrease blood flow to important parts of the body, including the brain, heart, and kidneys. Having untreated or uncontrolled hypertension can lead to: A heart attack. A stroke. A weakened blood vessel (aneurysm). Heart failure. Kidney damage. Eye damage. Metabolic syndrome. Memory and concentration problems. Vascular dementia. What actions can I take to manage this condition? Hypertension can be managed by making lifestyle changes and possibly by taking medicines. Your health care provider will help you make a plan to bring your blood pressure within a normal range. Nutrition  Eat a diet that is high in fiber and potassium, and low in salt (sodium), added sugar, and fat. An example eating plan is called the Dietary Approaches to Stop Hypertension (DASH) diet. To eat this way: Eat plenty of fresh fruits and vegetables. Try to fill one-half of your plate at each meal with fruits and vegetables. Eat whole grains, such as whole-wheat pasta, brown rice, or whole-grain bread. Fill about one-fourth of your plate with whole grains. Eat low-fat dairy products. Avoid fatty cuts of meat, processed or cured meats, and poultry with skin. Fill about one-fourth of your plate with lean proteins such as fish, chicken without skin, beans, eggs,  and tofu. Avoid pre-made and processed foods. These tend to be higher in sodium, added sugar, and fat. Reduce your daily sodium intake. Most people with hypertension should eat less than 1,500 mg of sodium a  day. Lifestyle  Work with your health care provider to maintain a healthy body weight or to lose weight. Ask what an ideal weight is for you. Get at least 30 minutes of exercise that causes your heart to beat faster (aerobic exercise) most days of the week. Activities may include walking, swimming, or biking. Include exercise to strengthen your muscles (resistance exercise), such as weight lifting, as part of your weekly exercise routine. Try to do these types of exercises for 30 minutes at least 3 days a week. Do not use any products that contain nicotine or tobacco, such as cigarettes, e-cigarettes, and chewing tobacco. If you need help quitting, ask your health care provider. Control any long-term (chronic) conditions you have, such as high cholesterol or diabetes. Identify your sources of stress and find ways to manage stress. This may include meditation, deep breathing, or making time for fun activities. Alcohol use Do not drink alcohol if: Your health care provider tells you not to drink. You are pregnant, may be pregnant, or are planning to become pregnant. If you drink alcohol: Limit how much you use to: 0-1 drink a day for women. 0-2 drinks a day for men. Be aware of how much alcohol is in your drink. In the [place] one drink equals one 12 oz bottle of beer (355 mL), one 5 oz glass of wine (148 mL), or one 1 oz glass of hard liquor (44 mL). Medicines Your health care provider may prescribe medicine if lifestyle changes are not enough to get your blood pressure under control and if: Your systolic blood pressure is 130 or higher. Your diastolic blood pressure is 80 or higher. Take medicines only as told by your health care provider. Follow the directions carefully. Blood pressure medicines must be taken as told by your health care provider. The medicine does not work as well when you skip doses. Skipping doses also puts you at risk for problems. Monitoring Before you monitor your blood  pressure: Do not smoke, drink caffeinated beverages, or exercise within 30 minutes before taking a measurement. Use the bathroom and empty your bladder (urinate). Sit quietly for at least 5 minutes before taking measurements. Monitor your blood pressure at home as told by your health care provider. To do this: Sit with your back straight and supported. Place your feet flat on the floor. Do not cross your legs. Support your arm on a flat surface, such as a table. Make sure your upper arm is at heart level. Each time you measure, take two or three readings one minute apart and record the results. You may also need to have your blood pressure checked regularly by your health care provider. General information Talk with your health care provider about your diet, exercise habits, and other lifestyle factors that may be contributing to hypertension. Review all the medicines you take with your health care provider because there may be side effects or interactions. Keep all visits as told by your health care provider. Your health care provider can help you create and adjust your plan for managing your high blood pressure. Where to find more information National Heart, Lung, and Blood Institute: https://wilson-eaton.com/ American Heart Association: www.heart.org Contact a health care provider if: You think you are having a reaction to medicines you have taken.  You have repeated (recurrent) headaches. You feel dizzy. You have swelling in your ankles. You have trouble with your vision. Get help right away if: You develop a severe headache or confusion. You have unusual weakness or numbness, or you feel faint. You have severe pain in your chest or abdomen. You vomit repeatedly. You have trouble breathing. These symptoms may represent a serious problem that is an emergency. Do not wait to see if the symptoms will go away. Get medical help right away. Call your local emergency services (911 in the [place] Do  not drive yourself to the hospital. Summary Hypertension is when the force of blood pumping through your arteries is too strong. If this condition is not controlled, it may put you at risk for serious complications. Your personal target blood pressure may vary depending on your medical conditions, your age, and other factors. For most people, a normal blood pressure is less than 120/80. Hypertension is managed by lifestyle changes, medicines, or both. Lifestyle changes to help manage hypertension include losing weight, eating a healthy, low-sodium diet, exercising more, stopping smoking, and limiting alcohol. This information is not intended to replace advice given to you by your health care provider. Make sure you discuss any questions you have with your health care provider. Document Revised: 10/19/2019 Document Reviewed: 09/01/2019 Elsevier Patient Education  2022 [employer].

## 2021-11-01 NOTE — Progress Notes (Signed)
Subjective:  Patient ID: [name], female    DOB: 1955-10-31  Age: 66 y.o. MRN: [id number]  CC:  Chief Complaint  Patient presents with   Hypertension    Changed meds last visit pt reports has been doing okay brought several readings today,     HPI [name] presents for    Hypertension: Amlodipine 2.5 mg daily, irbesartan 300 mg daily.  Medications were adjusted in December due to some frequent urination and possible HCTZ cause.  Stopped HCTZ, started amlodipine. Urination much better on new regimen.  No abd pain/dysuria.  Min HA prior has resolved.  Home readings:134/73.  No new leg swelling or new side effects.  Will need to transfer to new PCP due to drive distance. BP Readings from Last 3 Encounters:  11/01/21 128/82  10/11/21 128/76  03/01/21 126/74   Lab Results  Component Value Date   CREATININE 0.72 10/11/2021   Prediabetes: Slight increased A1c. Last visit. Plan on repeat testing in 6 months.  Lab Results  Component Value Date   HGBA1C 6.5 10/11/2021   Wt Readings from Last 3 Encounters:  11/01/21 136 lb 12.8 oz (62.1 kg)  10/11/21 136 lb 3.2 oz (61.8 kg)  03/01/21 133 lb 3.2 oz (60.4 kg)    History Patient Active Problem List   Diagnosis Date Noted   Encounter for Medicare annual wellness exam 04/30/2016   Prediabetes 03/11/2014   Hyperlipidemia    Hypertension    Vitamin D deficiency    TBI (traumatic brain injury)    Past Medical History:  Diagnosis Date   Arthritis    L knee   Hyperlipidemia    Hypertension    Prediabetes    Seizures ([place])    TBI (traumatic brain injury) ([place])    Memory issues    Vitamin D deficiency    Past Surgical History:  Procedure Laterality Date   ABDOMINAL HYSTERECTOMY     BSO   BREAST BIOPSY Left    KNEE ARTHROSCOPY Right    No Known Allergies Prior to Admission medications   Medication Sig Start Date End Date Taking? Authorizing Provider  ACCU-CHEK AVIVA PLUS test strip Check readings  once per week. 11/28/20  Yes Wendie Agreste, MD  Accu-Chek Softclix Lancets lancets Check reading once per week, 11/28/20  Yes Wendie Agreste, MD  Alcohol Swabs (B-D SINGLE USE SWABS REGULAR) PADS  12/18/19  Yes [provider]  amLODipine (NORVASC) 2.5 MG tablet Take 1 tablet (2.5 mg total) by mouth daily. 10/11/21  Yes Wendie Agreste, MD  Blood Glucose Monitoring Suppl (ACCU-CHEK AVIVA PLUS) w/Device KIT  12/18/19  Yes [provider]  Cholecalciferol (VITAMIN D PO) Take 5,000 Int'l Units by mouth daily.   Yes [provider]  fluticasone (FLONASE) 50 MCG/ACT nasal spray Place 1 spray into both nostrils daily. 11/28/20  Yes Wendie Agreste, MD  irbesartan (AVAPRO) 300 MG tablet Take 1 tablet (300 mg total) by mouth daily. 10/11/21  Yes Wendie Agreste, MD   Social History   Socioeconomic History   Marital status: Divorced    Spouse name: Not on file   Number of children: Not on file   Years of education: Not on file   Highest education level: Not on file  Occupational History   Not on file  Tobacco Use   Smoking status: Never   Smokeless tobacco: Never  Vaping Use   Vaping Use: Never used  Substance and Sexual Activity  Alcohol use: No   Drug use: No   Sexual activity: Not on file  Other Topics Concern   Not on file  Social History Narrative   Not on file   Social Determinants of Health   Financial Resource Strain: Not on file  Food Insecurity: Not on file  Transportation Needs: Not on file  Physical Activity: Not on file  Stress: Not on file  Social Connections: Not on file  Intimate Partner Violence: Not on file    Review of Systems  Constitutional:  Negative for fatigue and unexpected weight change.  Respiratory:  Negative for chest tightness and shortness of breath.   Cardiovascular:  Negative for chest pain, palpitations and leg swelling.  Gastrointestinal:  Negative for abdominal pain and blood in stool.  Neurological:   Negative for dizziness, syncope, light-headedness and headaches.    Objective:   Vitals:   11/01/21 1112  BP: 128/82  Pulse: 76  Resp: 16  Temp: 98.2 F (36.8 C)  TempSrc: Temporal  SpO2: 97%  Weight: 136 lb 12.8 oz (62.1 kg)  Height: _0  (1.549 m)     Physical Exam Vitals reviewed.  Constitutional:      Appearance: Normal appearance. She is well-developed.  HENT:     Head: Normocephalic and atraumatic.  Eyes:     Conjunctiva/sclera: Conjunctivae normal.     Pupils: Pupils are equal, round, and reactive to light.  Neck:     Vascular: No carotid bruit.  Cardiovascular:     Rate and Rhythm: Normal rate and regular rhythm.     Heart sounds: Normal heart sounds.  Pulmonary:     Effort: Pulmonary effort is normal.     Breath sounds: Normal breath sounds.  Abdominal:     Palpations: Abdomen is soft. There is no pulsatile mass.     Tenderness: There is no abdominal tenderness.  Musculoskeletal:     Right lower leg: No edema.     Left lower leg: No edema.  Skin:    General: Skin is warm and dry.  Neurological:     Mental Status: She is alert and oriented to person, place, and time.  Psychiatric:        Mood and Affect: Mood normal.        Behavior: Behavior normal.     Assessment & Plan:  [name] is a 66 y.o. female . Frequent urination  Essential hypertension  Improved urinary symptoms, likely was side effect with hydrochlorothiazide.  Tolerating amlodipine in its place.  Continue same doses for now.  Borderline BP in office, option of amlodipine 5 mg if elevated readings with home monitoring discussed, handout given.  17-monthfollow-up with new PCP due to travel distance.  Recommend repeat A1c at that time as some increase from last reading.  Diet/activity approach.  RTC precautions.  No orders of the defined types were placed in this encounter.  Patient Instructions  Glad to hear that the new blood pressure regimen has been better as far as the  frequent urination.  If frequent urination returns we can look at other causes.  Okay to remain on same medication for now.  See information below on managing high blood pressure.  If blood pressure starts to run higher than 130/80 consistently we have the option of increasing amlodipine but I am okay remaining on the same dose of medication for now.  It has been a pleasure taking care of you.  Please let [place] if we can help  further.  I do recommend following up with your new primary care provider in the next 5 months for repeat blood work as the last prediabetes test had increased slightly.  Let us know if there are questions and take care!   Managing Your Hypertension Hypertension, also called high blood pressure, is when the force of the blood pressing against the walls of the arteries is too strong. Arteries are blood vessels that carry blood from your heart throughout your body. Hypertension forces the heart to work harder to pump blood and may cause the arteries to become narrow or stiff. Understanding blood pressure readings Your personal target blood pressure may vary depending on your medical conditions, your age, and other factors. A blood pressure reading includes a higher number over a lower number. Ideally, your blood pressure should be below 120/80. You should know that: The first, or top, number is called the systolic pressure. It is a measure of the pressure in your arteries as your heart beats. The second, or bottom number, is called the diastolic pressure. It is a measure of the pressure in your arteries as the heart relaxes. Blood pressure is classified into four stages. Based on your blood pressure reading, your health care provider may use the following stages to determine what type of treatment you need, if any. Systolic pressure and diastolic pressure are measured in a unit called mmHg. Normal Systolic pressure: below 443. Diastolic pressure: below 80. Elevated Systolic  pressure: 154-008. Diastolic pressure: below 80. Hypertension stage 1 Systolic pressure: 676-195. Diastolic pressure: 09-32. Hypertension stage 2 Systolic pressure: 671 or above. Diastolic pressure: 90 or above. How can this condition affect me? Managing your hypertension is an important responsibility. Over time, hypertension can damage the arteries and decrease blood flow to important parts of the body, including the brain, heart, and kidneys. Having untreated or uncontrolled hypertension can lead to: A heart attack. A stroke. A weakened blood vessel (aneurysm). Heart failure. Kidney damage. Eye damage. Metabolic syndrome. Memory and concentration problems. Vascular dementia. What actions can I take to manage this condition? Hypertension can be managed by making lifestyle changes and possibly by taking medicines. Your health care provider will help you make a plan to bring your blood pressure within a normal range. Nutrition  Eat a diet that is high in fiber and potassium, and low in salt (sodium), added sugar, and fat. An example eating plan is called the Dietary Approaches to Stop Hypertension (DASH) diet. To eat this way: Eat plenty of fresh fruits and vegetables. Try to fill one-half of your plate at each meal with fruits and vegetables. Eat whole grains, such as whole-wheat pasta, brown rice, or whole-grain bread. Fill about one-fourth of your plate with whole grains. Eat low-fat dairy products. Avoid fatty cuts of meat, processed or cured meats, and poultry with skin. Fill about one-fourth of your plate with lean proteins such as fish, chicken without skin, beans, eggs, and tofu. Avoid pre-made and processed foods. These tend to be higher in sodium, added sugar, and fat. Reduce your daily sodium intake. Most people with hypertension should eat less than 1,500 mg of sodium a day. Lifestyle  Work with your health care provider to maintain a healthy body weight or to lose weight.  Ask what an ideal weight is for you. Get at least 30 minutes of exercise that causes your heart to beat faster (aerobic exercise) most days of the week. Activities may include walking, swimming, or biking. Include exercise to strengthen your  muscles (resistance exercise), such as weight lifting, as part of your weekly exercise routine. Try to do these types of exercises for 30 minutes at least 3 days a week. Do not use any products that contain nicotine or tobacco, such as cigarettes, e-cigarettes, and chewing tobacco. If you need help quitting, ask your health care provider. Control any long-term (chronic) conditions you have, such as high cholesterol or diabetes. Identify your sources of stress and find ways to manage stress. This may include meditation, deep breathing, or making time for fun activities. Alcohol use Do not drink alcohol if: Your health care provider tells you not to drink. You are pregnant, may be pregnant, or are planning to become pregnant. If you drink alcohol: Limit how much you use to: 0-1 drink a day for women. 0-2 drinks a day for men. Be aware of how much alcohol is in your drink. In the [place] one drink equals one 12 oz bottle of beer (355 mL), one 5 oz glass of wine (148 mL), or one 1 oz glass of hard liquor (44 mL). Medicines Your health care provider may prescribe medicine if lifestyle changes are not enough to get your blood pressure under control and if: Your systolic blood pressure is 130 or higher. Your diastolic blood pressure is 80 or higher. Take medicines only as told by your health care provider. Follow the directions carefully. Blood pressure medicines must be taken as told by your health care provider. The medicine does not work as well when you skip doses. Skipping doses also puts you at risk for problems. Monitoring Before you monitor your blood pressure: Do not smoke, drink caffeinated beverages, or exercise within 30 minutes before taking a  measurement. Use the bathroom and empty your bladder (urinate). Sit quietly for at least 5 minutes before taking measurements. Monitor your blood pressure at home as told by your health care provider. To do this: Sit with your back straight and supported. Place your feet flat on the floor. Do not cross your legs. Support your arm on a flat surface, such as a table. Make sure your upper arm is at heart level. Each time you measure, take two or three readings one minute apart and record the results. You may also need to have your blood pressure checked regularly by your health care provider. General information Talk with your health care provider about your diet, exercise habits, and other lifestyle factors that may be contributing to hypertension. Review all the medicines you take with your health care provider because there may be side effects or interactions. Keep all visits as told by your health care provider. Your health care provider can help you create and adjust your plan for managing your high blood pressure. Where to find more information National Heart, Lung, and Blood Institute: https://wilson-eaton.com/ American Heart Association: www.heart.org Contact a health care provider if: You think you are having a reaction to medicines you have taken. You have repeated (recurrent) headaches. You feel dizzy. You have swelling in your ankles. You have trouble with your vision. Get help right away if: You develop a severe headache or confusion. You have unusual weakness or numbness, or you feel faint. You have severe pain in your chest or abdomen. You vomit repeatedly. You have trouble breathing. These symptoms may represent a serious problem that is an emergency. Do not wait to see if the symptoms will go away. Get medical help right away. Call your local emergency services (911 in the [place] Do not drive  yourself to the hospital. Summary Hypertension is when the force of blood pumping through  your arteries is too strong. If this condition is not controlled, it may put you at risk for serious complications. Your personal target blood pressure may vary depending on your medical conditions, your age, and other factors. For most people, a normal blood pressure is less than 120/80. Hypertension is managed by lifestyle changes, medicines, or both. Lifestyle changes to help manage hypertension include losing weight, eating a healthy, low-sodium diet, exercising more, stopping smoking, and limiting alcohol. This information is not intended to replace advice given to you by your health care provider. Make sure you discuss any questions you have with your health care provider. Document Revised: 10/19/2019 Document Reviewed: 09/01/2019 Elsevier Patient Education  2022 [place],   Merri Ray, MD [place] 11/01/21 11:51 AM

## 2021-12-05 ENCOUNTER — Other Ambulatory Visit: Payer: Self-pay | Admitting: Family Medicine

## 2021-12-05 DIAGNOSIS — I10 Essential (primary) hypertension: Secondary | ICD-10-CM

## 2021-12-08 ENCOUNTER — Telehealth: Payer: Self-pay

## 2021-12-08 ENCOUNTER — Ambulatory Visit (INDEPENDENT_AMBULATORY_CARE_PROVIDER_SITE_OTHER): Payer: Medicare HMO | Admitting: Family Medicine

## 2021-12-08 VITALS — BP 140/82 | HR 62 | Temp 97.8°F | Resp 17 | Ht 61.0 in | Wt 135.2 lb

## 2021-12-08 DIAGNOSIS — R1030 Lower abdominal pain, unspecified: Secondary | ICD-10-CM | POA: Diagnosis not present

## 2021-12-08 DIAGNOSIS — I10 Essential (primary) hypertension: Secondary | ICD-10-CM | POA: Diagnosis not present

## 2021-12-08 DIAGNOSIS — R195 Other fecal abnormalities: Secondary | ICD-10-CM

## 2021-12-08 NOTE — Telephone Encounter (Signed)
Post appointment this morning pt notes she went to the restroom and noted some spots of blood in her underwear. Notes this has not happened previously. She did not have any burning or pain with urination. She notes she has not had abdominal pain today.   Pt advised to monitor her sxs and reports back to [place] Monday if any other sxs or more spotting occur. Also informed pt once PCP is able to review this concern we may call her with further directions or to make a secondary appointment.

## 2021-12-08 NOTE — Telephone Encounter (Signed)
Pt notes some mild pain in her stomach just below belly button as of about 1 hour ago but very mild. Notes no more bleeding since leaving the office will monitor over the weekend and go to urgent care if abdominal pain increases or any new spotting

## 2021-12-08 NOTE — Telephone Encounter (Signed)
Noted.  Unsure if this could have been hemorrhoidal with recent hard stool versus vaginal bleed.  Recommend she follow-up if any return of blood in underwear or noted blood with bowel movements or urination.  Let me know if there are further questions

## 2021-12-08 NOTE — Progress Notes (Signed)
Subjective:  Patient ID: [name], female    DOB: 12/14/1955  Age: 66 y.o. MRN: [id number]  CC:  Chief Complaint  Patient presents with   Hypertension    Pt notes she was getting stomach pains with new medication she has stopped this and the issue has resolved. Amlodipine and irbesartan. Pt has not started her old medication until approved     HPI [name] presents for   Hypertension: Follow-up from January 18.  We did adjust her medications last December due to some frequent urination and possible HCTZ cause.  HCTZ was discontinued, start amlodipine, continue to irbesartan 300 mg daily.  She was improving at last visit.  Since last visit she experienced some abdominal pain - lower abdomen that wrapped around to left side to the back. Started in past few weeks.  No fever. No n/v. No diarrhea, some hard stools at time of abdominal pain. No blood in urine or dysuria. Some sweats, no fever She thought pain was due to her blood pressure pill - stopped both meds few days later. Pain improved after a few days, but hard stool also getting better and pain improved. No pain now.  Off BP meds 3 days.  Home readings: 152/90 this morning, 139/87, 150/80, 146/79 past few days off meds.  BP Readings from Last 3 Encounters:  12/08/21 140/82  11/01/21 128/82  10/11/21 128/76   Lab Results  Component Value Date   CREATININE 0.72 10/11/2021      History Patient Active Problem List   Diagnosis Date Noted   Encounter for Medicare annual wellness exam 04/30/2016   Prediabetes 03/11/2014   Hyperlipidemia    Hypertension    Vitamin D deficiency    TBI (traumatic brain injury)    Past Medical History:  Diagnosis Date   Arthritis    L knee   Hyperlipidemia    Hypertension    Prediabetes    Seizures ([place])    TBI (traumatic brain injury) ([place])    Memory issues    Vitamin D deficiency    Past Surgical History:  Procedure Laterality Date   ABDOMINAL HYSTERECTOMY     BSO    BREAST BIOPSY Left    KNEE ARTHROSCOPY Right    No Known Allergies Prior to Admission medications   Medication Sig Start Date End Date Taking? Authorizing Provider  ACCU-CHEK AVIVA PLUS test strip Check readings once per week. 11/28/20  Yes Wendie Agreste, MD  Accu-Chek Softclix Lancets lancets Check reading once per week, 11/28/20  Yes Wendie Agreste, MD  Alcohol Swabs (B-D SINGLE USE SWABS REGULAR) PADS  12/18/19  Yes [provider]  Blood Glucose Monitoring Suppl (ACCU-CHEK AVIVA PLUS) w/Device KIT  12/18/19  Yes [provider]  Cholecalciferol (VITAMIN D PO) Take 5,000 Int'l Units by mouth daily.   Yes [provider]  fluticasone (FLONASE) 50 MCG/ACT nasal spray Place 1 spray into both nostrils daily. 11/28/20  Yes Wendie Agreste, MD  amLODipine (NORVASC) 2.5 MG tablet Take 1 tablet (2.5 mg total) by mouth daily. Patient not taking: Reported on 12/08/2021 10/11/21   Wendie Agreste, MD  irbesartan (AVAPRO) 300 MG tablet Take 1 tablet (300 mg total) by mouth daily. Patient not taking: Reported on 12/08/2021 10/11/21   Wendie Agreste, MD   Social History   Socioeconomic History   Marital status: Divorced    Spouse name: Not on file   Number of children: Not on file  Years of education: Not on file   Highest education level: Not on file  Occupational History   Not on file  Tobacco Use   Smoking status: Never   Smokeless tobacco: Never  Vaping Use   Vaping Use: Never used  Substance and Sexual Activity   Alcohol use: No   Drug use: No   Sexual activity: Not on file  Other Topics Concern   Not on file  Social History Narrative   Not on file   Social Determinants of Health   Financial Resource Strain: Not on file  Food Insecurity: Not on file  Transportation Needs: Not on file  Physical Activity: Not on file  Stress: Not on file  Social Connections: Not on file  Intimate Partner Violence: Not on file    Review of  Systems   Objective:   Vitals:   12/08/21 0946  BP: 140/82  Pulse: 62  Resp: 17  Temp: 97.8 F (36.6 C)  TempSrc: Temporal  SpO2: 100%  Weight: 135 lb 3.2 oz (61.3 kg)  Height: _0  (1.549 m)     Physical Exam Vitals reviewed.  Constitutional:      Appearance: Normal appearance. She is well-developed.  HENT:     Head: Normocephalic and atraumatic.  Eyes:     Conjunctiva/sclera: Conjunctivae normal.     Pupils: Pupils are equal, round, and reactive to light.  Neck:     Vascular: No carotid bruit.  Cardiovascular:     Rate and Rhythm: Normal rate and regular rhythm.     Heart sounds: Normal heart sounds.  Pulmonary:     Effort: Pulmonary effort is normal.     Breath sounds: Normal breath sounds.  Abdominal:     Palpations: Abdomen is soft. There is no pulsatile mass.     Tenderness: There is no abdominal tenderness (No pain, slight sensation with umbilical/lower abdominal palpation but not painful.).  Musculoskeletal:     Right lower leg: No edema.     Left lower leg: No edema.  Skin:    General: Skin is warm and dry.  Neurological:     Mental Status: She is alert and oriented to person, place, and time.  Psychiatric:        Mood and Affect: Mood normal.        Behavior: Behavior normal.    31 minutes spent during visit, including chart review, counseling and assimilation of information, exam, discussion of plan and clarification of questions, repetition of plan with understanding ultimately expressed, and chart completion.     Assessment & Plan:  [name] is a 66 y.o. female . Essential hypertension  Lower abdominal pain  Hard stool  Episode of abdominal pain as above, unlikely related to amlodipine.  No fevers, no nausea or vomiting and his symptoms have improved.  Did note some possible hard stools at that time, question constipation as cause especially with it being left-sided.  Without hematuria or urinary symptoms and improvement we will hold  on further testing at this time but RTC precautions given if any return of abdominal pain.    Okay to hold on amlodipine at this time, restart irbesartan 300 mg daily with RTC precautions if elevated home readings.  Plan discussed with questions answered and understanding of plan expressed  No orders of the defined types were placed in this encounter.  Patient Instructions  I am glad to hear that your abdominal pain has improved.  As we discussed it could have been  due to some constipation.  It would be less likely that the amlodipine caused abdominal pain but I do not think you need to be on that medication for now.    Restart the irbesartan 32m once per day.  Monitor your home blood pressures and if those are over 140/90 on that medication let me know.  Make sure to drink plenty of fluids, fiber in the diet and see information below on preventing constipation.  If abdominal pain returns please return for recheck.  Thank you for coming in today. Constipation, Adult Constipation is when a person has fewer than three bowel movements in a week, has difficulty having a bowel movement, or has stools (feces) that are dry, hard, or larger than normal. Constipation may be caused by an underlying condition. It may become worse with age if a person takes certain medicines and does not take in enough fluids. Follow these instructions at home: Eating and drinking  Eat foods that have a lot of fiber, such as beans, whole grains, and fresh fruits and vegetables. Limit foods that are low in fiber and high in fat and processed sugars, such as fried or sweet foods. These include french fries, hamburgers, cookies, candies, and soda. Drink enough fluid to keep your urine pale yellow. General instructions Exercise regularly or as told by your health care provider. Try to do 150 minutes of moderate exercise each week. Use the bathroom when you have the urge to go. Do not hold it in. Take over-the-counter and  prescription medicines only as told by your health care provider. This includes any fiber supplements. During bowel movements: Practice deep breathing while relaxing the lower abdomen. Practice pelvic floor relaxation. Watch your condition for any changes. Let your health care provider know about them. Keep all follow-up visits as told by your health care provider. This is important. Contact a health care provider if: You have pain that gets worse. You have a fever. You do not have a bowel movement after 4 days. You vomit. You are not hungry or you lose weight. You are bleeding from the opening between the buttocks (anus). You have thin, pencil-like stools. Get help right away if: You have a fever and your symptoms suddenly get worse. You leak stool or have blood in your stool. Your abdomen is bloated. You have severe pain in your abdomen. You feel dizzy or you faint. Summary Constipation is when a person has fewer than three bowel movements in a week, has difficulty having a bowel movement, or has stools (feces) that are dry, hard, or larger than normal. Eat foods that have a lot of fiber, such as beans, whole grains, and fresh fruits and vegetables. Drink enough fluid to keep your urine pale yellow. Take over-the-counter and prescription medicines only as told by your health care provider. This includes any fiber supplements. This information is not intended to replace advice given to you by your health care provider. Make sure you discuss any questions you have with your health care provider. Document Revised: 08/19/2019 Document Reviewed: 08/19/2019 Elsevier Patient Education  2[place]   Abdominal Pain, Adult Pain in the abdomen (abdominal pain) can be caused by many things. Often, abdominal pain is not serious and it gets better with no treatment or by being treated at home. However, sometimes abdominal pain is serious. Your health care provider will ask questions  about your medical history and do a physical exam to try to determine the cause of your abdominal pain. Follow  these instructions at home: Medicines Take over-the-counter and prescription medicines only as told by your health care provider. Do not take a laxative unless told by your health care provider. General instructions  Watch your condition for any changes. Drink enough fluid to keep your urine pale yellow. Keep all follow-up visits as told by your health care provider. This is important. Contact a health care provider if: Your abdominal pain changes or gets worse. You are not hungry or you lose weight without trying. You are constipated or have diarrhea for more than 2-3 days. You have pain when you urinate or have a bowel movement. Your abdominal pain wakes you up at night. Your pain gets worse with meals, after eating, or with certain foods. You are vomiting and cannot keep anything down. You have a fever. You have blood in your urine. Get help right away if: Your pain does not go away as soon as your health care provider told you to expect. You cannot stop vomiting. Your pain is only in areas of the abdomen, such as the right side or the left lower portion of the abdomen. Pain on the right side could be caused by appendicitis. You have bloody or black stools, or stools that look like tar. You have severe pain, cramping, or bloating in your abdomen. You have signs of dehydration, such as: Dark urine, very little urine, or no urine. Cracked lips. Dry mouth. Sunken eyes. Sleepiness. Weakness. You have trouble breathing or chest pain. Summary Often, abdominal pain is not serious and it gets better with no treatment or by being treated at home. However, sometimes abdominal pain is serious. Watch your condition for any changes. Take over-the-counter and prescription medicines only as told by your health care provider. Contact a health care provider if your abdominal pain  changes or gets worse. Get help right away if you have severe pain, cramping, or bloating in your abdomen. This information is not intended to replace advice given to you by your health care provider. Make sure you discuss any questions you have with your health care provider. Document Revised: 11/20/2019 Document Reviewed: 02/09/2019 Elsevier Patient Education  2022 [place],   Merri Ray, MD [place] 12/08/21 10:27 AM

## 2021-12-08 NOTE — Patient Instructions (Signed)
I am glad to hear that your abdominal pain has improved.  As we discussed it could have been due to some constipation.  It would be less likely that the amlodipine caused abdominal pain but I do not think you need to be on that medication for now.    Restart the irbesartan 300mg  once per day.  Monitor your home blood pressures and if those are over 140/90 on that medication let me know.  Make sure to drink plenty of fluids, fiber in the diet and see information below on preventing constipation.  If abdominal pain returns please return for recheck.  Thank you for coming in today. Constipation, Adult Constipation is when a person has fewer than three bowel movements in a week, has difficulty having a bowel movement, or has stools (feces) that are dry, hard, or larger than normal. Constipation may be caused by an underlying condition. It may become worse with age if a person takes certain medicines and does not take in enough fluids. Follow these instructions at home: Eating and drinking  Eat foods that have a lot of fiber, such as beans, whole grains, and fresh fruits and vegetables. Limit foods that are low in fiber and high in fat and processed sugars, such as fried or sweet foods. These include french fries, hamburgers, cookies, candies, and soda. Drink enough fluid to keep your urine pale yellow. General instructions Exercise regularly or as told by your health care provider. Try to do 150 minutes of moderate exercise each week. Use the bathroom when you have the urge to go. Do not hold it in. Take over-the-counter and prescription medicines only as told by your health care provider. This includes any fiber supplements. During bowel movements: Practice deep breathing while relaxing the lower abdomen. Practice pelvic floor relaxation. Watch your condition for any changes. Let your health care provider know about them. Keep all follow-up visits as told by your health care provider. This is  important. Contact a health care provider if: You have pain that gets worse. You have a fever. You do not have a bowel movement after 4 days. You vomit. You are not hungry or you lose weight. You are bleeding from the opening between the buttocks (anus). You have thin, pencil-like stools. Get help right away if: You have a fever and your symptoms suddenly get worse. You leak stool or have blood in your stool. Your abdomen is bloated. You have severe pain in your abdomen. You feel dizzy or you faint. Summary Constipation is when a person has fewer than three bowel movements in a week, has difficulty having a bowel movement, or has stools (feces) that are dry, hard, or larger than normal. Eat foods that have a lot of fiber, such as beans, whole grains, and fresh fruits and vegetables. Drink enough fluid to keep your urine pale yellow. Take over-the-counter and prescription medicines only as told by your health care provider. This includes any fiber supplements. This information is not intended to replace advice given to you by your health care provider. Make sure you discuss any questions you have with your health care provider. Document Revised: 08/19/2019 Document Reviewed: 08/19/2019 Elsevier Patient Education  [place].   Abdominal Pain, Adult Pain in the abdomen (abdominal pain) can be caused by many things. Often, abdominal pain is not serious and it gets better with no treatment or by being treated at home. However, sometimes abdominal pain is serious. Your health care provider will ask questions about your  medical history and do a physical exam to try to determine the cause of your abdominal pain. Follow these instructions at home: Medicines Take over-the-counter and prescription medicines only as told by your health care provider. Do not take a laxative unless told by your health care provider. General instructions  Watch your condition for any changes. Drink  enough fluid to keep your urine pale yellow. Keep all follow-up visits as told by your health care provider. This is important. Contact a health care provider if: Your abdominal pain changes or gets worse. You are not hungry or you lose weight without trying. You are constipated or have diarrhea for more than 2-3 days. You have pain when you urinate or have a bowel movement. Your abdominal pain wakes you up at night. Your pain gets worse with meals, after eating, or with certain foods. You are vomiting and cannot keep anything down. You have a fever. You have blood in your urine. Get help right away if: Your pain does not go away as soon as your health care provider told you to expect. You cannot stop vomiting. Your pain is only in areas of the abdomen, such as the right side or the left lower portion of the abdomen. Pain on the right side could be caused by appendicitis. You have bloody or black stools, or stools that look like tar. You have severe pain, cramping, or bloating in your abdomen. You have signs of dehydration, such as: Dark urine, very little urine, or no urine. Cracked lips. Dry mouth. Sunken eyes. Sleepiness. Weakness. You have trouble breathing or chest pain. Summary Often, abdominal pain is not serious and it gets better with no treatment or by being treated at home. However, sometimes abdominal pain is serious. Watch your condition for any changes. Take over-the-counter and prescription medicines only as told by your health care provider. Contact a health care provider if your abdominal pain changes or gets worse. Get help right away if you have severe pain, cramping, or bloating in your abdomen. This information is not intended to replace advice given to you by your health care provider. Make sure you discuss any questions you have with your health care provider. Document Revised: 11/20/2019 Document Reviewed: 02/09/2019 Elsevier Patient Education  [place].

## 2021-12-12 ENCOUNTER — Encounter (HOSPITAL_COMMUNITY): Payer: Self-pay | Admitting: Emergency Medicine

## 2021-12-12 ENCOUNTER — Ambulatory Visit (HOSPITAL_COMMUNITY)
Admission: EM | Admit: 2021-12-12 | Discharge: 2021-12-12 | Disposition: A | Discharge status: Home / Self Care | Payer: Medicare HMO | Attending: Emergency Medicine | Admitting: Emergency Medicine

## 2021-12-12 ENCOUNTER — Other Ambulatory Visit: Payer: Self-pay

## 2021-12-12 DIAGNOSIS — L02811 Cutaneous abscess of head [any part, except face]: Secondary | ICD-10-CM

## 2021-12-12 MED ORDER — DOXYCYCLINE HYCLATE 100 MG PO CAPS: 100.0000 mg | ORAL_CAPSULE | Freq: Two times a day (BID) | ORAL | 0 refills | Status: DC

## 2021-12-12 NOTE — Discharge Instructions (Addendum)
Epsom salt soaks to area 3-4 times daily - take antibiotic as directed-return if not improving or worse at anytime

## 2021-12-12 NOTE — ED Triage Notes (Signed)
Pt reports a "knot" of the right side side of her head. States knot has been present for about a week and is painful to touch.

## 2021-12-12 NOTE — ED Provider Notes (Signed)
[facility]    CSN: [id number] Arrival date & time: 12/12/21  1036      History   Chief Complaint Chief Complaint  Patient presents with   Knot on Head    HPI [name] is a 66 y.o. female. present to [facility] complaining of painful hard knot to right side of scalp for the past week. She denies trauma or fever.she does have an associated headache from the pain which tylenol helps  HPI  Past Medical History:  Diagnosis Date   Arthritis    L knee   Hyperlipidemia    Hypertension    Prediabetes    Seizures ([facility])    TBI (traumatic brain injury) ([place])    Memory issues    Vitamin D deficiency     Patient Active Problem List   Diagnosis Date Noted   Encounter for Medicare annual wellness exam 04/30/2016   Prediabetes 03/11/2014   Hyperlipidemia    Hypertension    Vitamin D deficiency    TBI (traumatic brain injury)     Past Surgical History:  Procedure Laterality Date   ABDOMINAL HYSTERECTOMY     BSO   BREAST BIOPSY Left    KNEE ARTHROSCOPY Right     OB History   No obstetric history on file.      Home Medications    Prior to Admission medications   Medication Sig Start Date End Date Taking? Authorizing Provider  ACCU-CHEK AVIVA PLUS test strip Check readings once per week. 11/28/20   Wendie Agreste, MD  Accu-Chek Softclix Lancets lancets Check reading once per week, 11/28/20   Wendie Agreste, MD  Alcohol Swabs (B-D SINGLE USE SWABS REGULAR) PADS  12/18/19   [provider]  Blood Glucose Monitoring Suppl (ACCU-CHEK AVIVA PLUS) w/Device KIT  12/18/19   [provider]  Cholecalciferol (VITAMIN D PO) Take 5,000 Int'l Units by mouth daily.    [provider]  fluticasone (FLONASE) 50 MCG/ACT nasal spray Place 1 spray into both nostrils daily. 11/28/20   Wendie Agreste, MD  irbesartan (AVAPRO) 300 MG tablet Take 1 tablet (300 mg total) by mouth daily. Patient not taking: Reported on 12/08/2021 10/11/21   Wendie Agreste, MD    Family History Family History  Problem Relation Age of Onset   Hypertension Mother    Diabetes Mother    Heart disease Father    Cancer Brother    Kidney disease Other     Social History Social History   Tobacco Use   Smoking status: Never   Smokeless tobacco: Never  Vaping Use   Vaping Use: Never used  Substance Use Topics   Alcohol use: No   Drug use: No     Allergies   Patient has no known allergies.   Review of Systems Review of Systems  Neurological:  Positive for headaches (secondary to pain from scalp- mild).  All other systems reviewed and are negative.   Physical Exam Triage Vital Signs ED Triage Vitals  Enc Vitals Group     BP      Pulse      Resp      Temp      Temp src      SpO2      Weight      Height      Head Circumference      Peak Flow      Pain Score      Pain Loc  Pain Edu?      Excl. in [place]?    No data found.  Updated Vital Signs BP (!) 144/85 (BP Location: Right Arm)    Pulse 78    Temp 98.3 F (36.8 C) (Oral)    Resp 16    Ht 5' 1"  (1.549 m)    Wt 135 lb 3.2 oz (61.3 kg)    SpO2 96%    BMI 25.55 kg/m   Visual Acuity Right Eye Distance:   Left Eye Distance:   Bilateral Distance:    Right Eye Near:   Left Eye Near:    Bilateral Near:     Physical Exam Vitals and nursing note reviewed.  Constitutional:      General: She is not in acute distress.    Appearance: Normal appearance. She is normal weight.  HENT:     Head: Normocephalic and atraumatic.      Comments:  <1cm abscess noted on right scalp with central scab which is non draining, ttp mild induration no fluctuance no secondary signs of cellulitis.    Right Ear: External ear normal.     Left Ear: External ear normal.     Nose: Nose normal.  Eyes:     Conjunctiva/sclera: Conjunctivae normal.  Musculoskeletal:     Cervical back: Normal range of motion and neck supple.  Lymphadenopathy:     Cervical: No cervical adenopathy.  Skin:    General:  Skin is warm and dry.  Neurological:     Mental Status: She is alert. Mental status is at baseline.     UC Treatments / Results  Labs (all labs ordered are listed, but only abnormal results are displayed) Labs Reviewed - No data to display  EKG   Radiology No results found.  Procedures Procedures (including critical care time)  Medications Ordered in UC Medications - No data to display  Initial Impression / Assessment and Plan / UC Course  I have reviewed the triage vital signs and the nursing notes.  Pertinent labs & imaging results that were available during my care of the patient were reviewed by me and considered in my medical decision making (see chart for details).   Pt presents with a <1cm abscess noted on right scalp with central scab which is non draining, ttp mild induration no fluctuance no secondary signs of cellulitis. Pt instructed in epsom salt soaks 3-4 times daily and oral abx doxycycline for 5 days- she will return if worse or new symptoms for reevaluation and possible I and D. Motrin , tylenol suggested for pain   Final Clinical Impressions(s) / UC Diagnoses   Final diagnoses:  Abscess of scalp     Discharge Instructions      Epsom salt soaks to area 3-4 times daily - take antibiotic as directed-return if not improving or worse at anytime    ED Prescriptions     Medication Sig Dispense Auth. Provider   doxycycline (VIBRAMYCIN) 100 MG capsule Take 1 capsule (100 mg total) by mouth 2 (two) times daily. 20 capsule Hezzie Bump, NP      PDMP not reviewed this encounter.   Hezzie Bump, NP 12/12/21 1214

## 2021-12-14 DIAGNOSIS — Z8249 Family history of ischemic heart disease and other diseases of the circulatory system: Secondary | ICD-10-CM | POA: Diagnosis not present

## 2021-12-14 DIAGNOSIS — Z833 Family history of diabetes mellitus: Secondary | ICD-10-CM | POA: Diagnosis not present

## 2021-12-14 DIAGNOSIS — I10 Essential (primary) hypertension: Secondary | ICD-10-CM | POA: Diagnosis not present

## 2021-12-14 DIAGNOSIS — R32 Unspecified urinary incontinence: Secondary | ICD-10-CM | POA: Diagnosis not present

## 2021-12-14 DIAGNOSIS — E663 Overweight: Secondary | ICD-10-CM | POA: Diagnosis not present

## 2021-12-14 DIAGNOSIS — M1990 Unspecified osteoarthritis, unspecified site: Secondary | ICD-10-CM | POA: Diagnosis not present

## 2021-12-14 DIAGNOSIS — I739 Peripheral vascular disease, unspecified: Secondary | ICD-10-CM | POA: Diagnosis not present

## 2021-12-14 DIAGNOSIS — G8929 Other chronic pain: Secondary | ICD-10-CM | POA: Diagnosis not present

## 2021-12-14 DIAGNOSIS — Z7409 Other reduced mobility: Secondary | ICD-10-CM | POA: Diagnosis not present

## 2021-12-18 ENCOUNTER — Other Ambulatory Visit: Payer: Self-pay | Admitting: Family Medicine

## 2021-12-18 DIAGNOSIS — Z1231 Encounter for screening mammogram for malignant neoplasm of breast: Secondary | ICD-10-CM

## 2021-12-19 ENCOUNTER — Telehealth: Payer: Self-pay

## 2021-12-19 NOTE — Telephone Encounter (Signed)
Chief Complaint Blood Pressure High ?Reason for Call Symptomatic / Request for Health Information ?Initial Comment Caller states her blood pressure is a little high and ?she does not feel well. The office does not have ?any open appt and needs her traige. This morning ?her blood pressure is 173/19/76. Caller states she ?wants to know what medicine she should take. ?Translation No ?Nurse Assessment ?Nurse: Adrian Blackwater, RN, [facility]ings Date/Time [name] Time): 12/19/2021 9:24:39 AM ?Confirm and document reason for call. If ?symptomatic, describe symptoms. ?---Caller states she took her BP last night due to being ?dizzy and her BP was 149/77 last PM, this AM it was ?173/90 and HR 76. Pt mentions that she feels a little ?dizzy. ?Does the patient have any new or worsening ?symptoms? ---Yes ?Will a triage be completed? ---Yes ?Related visit to physician within the last 2 weeks? ---Yes ?Does the PT have any chronic conditions? (i.e. ?diabetes, asthma, this includes High risk factors for ?pregnancy, etc.) ?---Yes ?List chronic conditions. ---HTN, Back spasms, ?Is this a behavioral health or substance abuse call? ---No ?Guidelines ?Guideline Title Affirmed Question Affirmed Notes Nurse Date/Time (Eastern ?Time) ?Blood Pressure - ?High ?Systolic BP >= 301 ?OR Diastolic >= 601 ?Adrian Blackwater, RN, Claiborne Billings 12/19/2021 9:30:36 AM ?Disp. Time (Eastern ?Time) Disposition Final User ?PLEASE NOTE: All timestamps contained within this report are represented as [place] Standard Time. ?CONFIDENTIALTY NOTICE: This fax transmission is intended only for the addressee. It contains information that is legally privileged, confidential or ?otherwise protected from use or disclosure. If you are not the intended recipient, you are strictly prohibited from reviewing, disclosing, copying using ?or disseminating any of this information or taking any action in reliance on or regarding this information. If you have received this fax in error, please ?notify us immediately  by telephone so that we can arrange for its return to [place]. Phone: [phone], Toll-Free: [phone], Fax: [phone] ?Page: 2 of 2 ?Call Id: [id number] ?12/19/2021 9:33:48 AM SEE PCP WITHIN 3 DAYS Yes Adrian Blackwater, RN, [facility] Billings ?Caller Disagree/Comply Comply ?Caller Understands Yes ?PreDisposition Did not know what to do ?Care Advice Given Per Guideline ?SEE PCP WITHIN 3 DAYS: * You need to be seen within 2 or 3 days. HIGH BLOOD PRESSURE: * Untreated high blood ?pressure may cause damage to your heart, brain, kidneys, and eyes. HIGH BLOOD PRESSURE - LIFESTYLE MODIFICATIONS: ?* The following things can help you reduce your blood pressure. * EAT HEALTHY: Eat a diet rich in fresh fruits and vegetables, ?dietary fiber, non-animal protein (e.g., soy), and low-fat dairy products. Avoid foods with a high content of saturated fat or ?cholesterol. CALL BACK IF: * Weakness or numbness of the face, arm or leg on one side of the body occurs * You become worse * ?Your blood pressure is over 180/110 ?Referrals ?REFERRED TO PCP OFFICE ?

## 2021-12-19 NOTE — Telephone Encounter (Signed)
Pt already has appt scheduled. 

## 2021-12-20 ENCOUNTER — Encounter: Payer: Self-pay | Admitting: Registered Nurse

## 2021-12-20 ENCOUNTER — Other Ambulatory Visit: Payer: Self-pay

## 2021-12-20 ENCOUNTER — Ambulatory Visit (INDEPENDENT_AMBULATORY_CARE_PROVIDER_SITE_OTHER): Payer: Medicare HMO | Admitting: Registered Nurse

## 2021-12-20 VITALS — BP 162/72 | HR 66 | Temp 97.7°F | Resp 18 | Ht 61.0 in | Wt 134.8 lb

## 2021-12-20 DIAGNOSIS — Z1211 Encounter for screening for malignant neoplasm of colon: Secondary | ICD-10-CM

## 2021-12-20 DIAGNOSIS — I10 Essential (primary) hypertension: Secondary | ICD-10-CM | POA: Diagnosis not present

## 2021-12-20 MED ORDER — IRBESARTAN-HYDROCHLOROTHIAZIDE 300-12.5 MG PO TABS: 1.0000 | ORAL_TABLET | Freq: Every day | ORAL | 0 refills | Status: DC

## 2021-12-20 NOTE — Progress Notes (Signed)
? ?Established Patient Office Visit ? ?Subjective:  ?Patient ID: [name], female    DOB: July 11, 1956  Age: 66 y.o. MRN: [id number] ? ?CC:  ?Chief Complaint  ?Patient presents with  ? Hypertension  ?  Patient states she is here because her BP has been elevated and some headaches  ? ? ?HPI ?[name] presents for elevated bp, headaches. ? ?Hypertension: ?Patient Currently taking: irbesartan 324m po qd.  ?Good effect. No AEs. ?Had been on amlodipine (hard stools), metoprolol (fatigue), and hctz (frequent urination) in the past.  ?Denies CV symptoms including: chest pain, shob, doe, visual changes, fatigue, claudication, and dependent edema.  ? ?Previous readings and labs: ?BP Readings from Last 3 Encounters:  ?12/20/21 (!) 162/72  ?12/12/21 (!) 144/85  ?12/08/21 140/82  ? ?Lab Results  ?Component Value Date  ? CREATININE 0.72 10/11/2021  ? ?She has noted some headaches. Onset this morning. No neuro changes or cognitive changes. Hx of tbi but stable. ? ? ?Also notes - due for colon ca screen. Hx of colonoscopy. Requesting referral placed ? ?Past Medical History:  ?Diagnosis Date  ? Arthritis   ? L knee  ? Hyperlipidemia   ? Hypertension   ? Prediabetes   [place]   ? TBI (traumatic brain injury)   ? Memory issues   ? Vitamin D deficiency   ? ? ?Past Surgical History:  ?Procedure Laterality Date  ? ABDOMINAL HYSTERECTOMY    ? BSO  ? BREAST BIOPSY Left   ? KNEE ARTHROSCOPY Right   ? ? ?Family History  ?Problem Relation Age of Onset  ? Hypertension Mother   ? Diabetes Mother   ? Heart disease Father   ? Cancer Brother   ? Kidney disease Other   ? ? ?Social History  ? ?Socioeconomic History  ? Marital status: Divorced  ?  Spouse name: Not on file  ? Number of children: Not on file  ? Years of education: Not on file  ? Highest education level: Not on file  ?Occupational History  ? Not on file  ?Tobacco Use  ? Smoking status: Never  ? Smokeless tobacco: Never  ?Vaping Use  ? Vaping Use: Never used   ?Substance and Sexual Activity  ? Alcohol use: No  ? Drug use: No  ? Sexual activity: Not on file  ?Other Topics Concern  ? Not on file  ?Social History Narrative  ? Not on file  ? ?Social Determinants of Health  ? ?Financial Resource Strain: Not on file  ?Food Insecurity: Not on file  ?Transportation Needs: Not on file  ?Physical Activity: Not on file  ?Stress: Not on file  ?Social Connections: Not on file  ?Intimate Partner Violence: Not on file  ? ? ?Outpatient Medications Prior to Visit  ?Medication Sig Dispense Refill  ? ACCU-CHEK AVIVA PLUS test strip Check readings once per week. 100 each 0  ? Accu-Chek Softclix Lancets lancets Check reading once per week, 100 each 0  ? Alcohol Swabs (B-D SINGLE USE SWABS REGULAR) PADS     ? Blood Glucose Monitoring Suppl (ACCU-CHEK AVIVA PLUS) w/Device KIT     ? Cholecalciferol (VITAMIN D PO) Take 5,000 Int'l Units by mouth daily.    ? doxycycline (VIBRAMYCIN) 100 MG capsule Take 1 capsule (100 mg total) by mouth 2 (two) times daily. 20 capsule 0  ? fluticasone (FLONASE) 50 MCG/ACT nasal spray Place 1 spray into both nostrils daily. 16 g 6  ? irbesartan (AVAPRO) 300 MG  tablet Take 1 tablet (300 mg total) by mouth daily. (Patient not taking: Reported on 12/20/2021) 90 tablet 1  ? ?No facility-administered medications prior to visit.  ? ? ?No Known Allergies ? ?ROS ?Review of Systems  ?Constitutional: Negative.   ?HENT: Negative.    ?Eyes: Negative.   ?Respiratory: Negative.    ?Cardiovascular: Negative.   ?Gastrointestinal: Negative.   ?Genitourinary: Negative.   ?Musculoskeletal: Negative.   ?Skin: Negative.   ?Neurological: Negative.   ?Psychiatric/Behavioral: Negative.    ?All other systems reviewed and are negative. ? ?  ?Objective:  ?  ?Physical Exam ?Vitals and nursing note reviewed.  ?Constitutional:   ?   General: She is not in acute distress. ?   Appearance: Normal appearance. She is normal weight. She is not ill-appearing, toxic-appearing or diaphoretic.   ?Cardiovascular:  ?   Rate and Rhythm: Normal rate and regular rhythm.  ?   Heart sounds: Normal heart sounds. No murmur heard. ?  No friction rub. No gallop.  ?Pulmonary:  ?   Effort: Pulmonary effort is normal. No respiratory distress.  ?   Breath sounds: Normal breath sounds. No stridor. No wheezing, rhonchi or rales.  ?Chest:  ?   Chest wall: No tenderness.  ?Skin: ?   General: Skin is warm and dry.  ?Neurological:  ?   General: No focal deficit present.  ?   Mental Status: She is alert and oriented to person, place, and time. Mental status is at baseline.  ?   Cranial Nerves: No cranial nerve deficit.  ?   Motor: No weakness.  ?   Gait: Gait normal.  ?Psychiatric:     ?   Mood and Affect: Mood normal.     ?   Behavior: Behavior normal.     ?   Thought Content: Thought content normal.     ?   Judgment: Judgment normal.  ? ? ?BP (!) 162/72   Pulse 66   Temp 97.7 ?F (36.5 ?C) (Temporal)   Resp 18   Ht _0  (1.549 m)   Wt 134 lb 12.8 oz (61.1 kg)   SpO2 99%   BMI 25.47 kg/m?  ?Wt Readings from Last 3 Encounters:  ?12/20/21 134 lb 12.8 oz (61.1 kg)  ?12/12/21 135 lb 3.2 oz (61.3 kg)  ?12/08/21 135 lb 3.2 oz (61.3 kg)  ? ? ? ?Health Maintenance Due  ?Topic Date Due  ? Pneumonia Vaccine 85+ Years old (2 - PCV) 04/14/2014  ? COLONOSCOPY (Pts 45-79yr Insurance coverage will need to be confirmed)  01/12/2018  ? ? ?There are no preventive care reminders to display for this patient. ? ?Lab Results  ?Component Value Date  ? TSH 3.220 09/12/2020  ? ?Lab Results  ?Component Value Date  ? WBC 3.4 (L) 08/30/2020  ? HGB 13.2 08/30/2020  ? HCT 39.8 08/30/2020  ? MCV 93.9 08/30/2020  ? PLT 149 (L) 08/30/2020  ? ?Lab Results  ?Component Value Date  ? NA 138 10/11/2021  ? K 3.7 10/11/2021  ? CO2 31 10/11/2021  ? GLUCOSE 83 10/11/2021  ? BUN 18 10/11/2021  ? CREATININE 0.72 10/11/2021  ? BILITOT 0.8 10/11/2021  ? ALKPHOS 92 10/11/2021  ? AST 17 10/11/2021  ? ALT 16 10/11/2021  ? PROT 7.9 10/11/2021  ? ALBUMIN 4.4 10/11/2021   ? CALCIUM 9.7 10/11/2021  ? ANIONGAP 9 08/30/2020  ? GFR 88.10 10/11/2021  ? ?Lab Results  ?Component Value Date  ? CHOL 170 09/12/2020  ? ?Lab  Results  ?Component Value Date  ? HDL 67 09/12/2020  ? ?Lab Results  ?Component Value Date  ? [place] 87 09/12/2020  ? ?Lab Results  ?Component Value Date  ? TRIG 90 09/12/2020  ? ?Lab Results  ?Component Value Date  ? CHOLHDL 2.5 09/12/2020  ? ?Lab Results  ?Component Value Date  ? HGBA1C 6.5 10/11/2021  ? ? ?  ?Assessment & Plan:  ? ?Problem List Items Addressed This Visit   ?None ?Visit Diagnoses   ? ? Essential hypertension    -  Primary  ? Relevant Medications  ? irbesartan-hydrochlorothiazide (AVALIDE) 300-12.5 MG tablet  ? Colon cancer screening      ? Relevant Orders  ? Ambulatory referral to Gastroenterology  ? ?  ? ? ?Meds ordered this encounter  ?Medications  ? irbesartan-hydrochlorothiazide (AVALIDE) 300-12.5 MG tablet  ?  Sig: Take 1 tablet by mouth daily.  ?  Dispense:  90 tablet  ?  Refill:  0  ?  Order Specific Question:   Supervising Provider  ?  Answer:   Carlota Raspberry, JEFFREY R [[id number]]  ? ? ?Follow-up: Return in about 2 weeks (around 01/03/2022) for Nurse Visit - BP check.  ? ?PLAN ?Exam unremarkable ?Stop irbesartan. Start irbesartan-hctz. Pt willing to manage frequent urination in interest of BP control.  ?Refer to GI for colonoscopy ?Patient encouraged to call clinic with any questions, comments, or concerns. ? ? ?Maximiano Coss, NP ?

## 2021-12-20 NOTE — Patient Instructions (Addendum)
Ms. Sittner - ? ?Great to meet you ? ?Stop the irbesartan. Start the [employer]. ? ?Come back in 2-3 weeks for a blood pressure check nurse visit. Follow up with Dr. Carlota Raspberry as scheduled in June. ? ?Call sooner with concerns ? ?Thanks, ? ?Rich  ? ? ? ?If you have lab work done today you will be contacted with your lab results within the next 2 weeks.  If you have not heard from [place] then please contact us. The fastest way to get your results is to register for My Chart. ? ? ?IF you received an x-ray today, you will receive an invoice from [facility]. Please contact [facility] at [phone] with questions or concerns regarding your invoice.  ? ?IF you received labwork today, you will receive an invoice from [facility]. Please contact [facility] at [phone] with questions or concerns regarding your invoice.  ? ?Our billing staff will not be able to assist you with questions regarding bills from these companies. ? ?You will be contacted with the lab results as soon as they are available. The fastest way to get your results is to activate your My Chart account. Instructions are located on the last page of this paperwork. If you have not heard from [place] regarding the results in 2 weeks, please contact this office. ?  ? ? ?

## 2022-01-03 ENCOUNTER — Ambulatory Visit (INDEPENDENT_AMBULATORY_CARE_PROVIDER_SITE_OTHER): Payer: Medicare HMO | Admitting: Family Medicine

## 2022-01-03 VITALS — BP 144/72

## 2022-01-03 DIAGNOSIS — I10 Essential (primary) hypertension: Secondary | ICD-10-CM

## 2022-01-03 NOTE — Progress Notes (Signed)
[name] is a 66 y.o. female presents to the office today for Blood pressure recheck secondary to elevated BP [Ex: elevated BP in office, med start, regimen change].  ?Blood pressure medication: Irbesartan-hctz 300-12.5 1 tab daily (med, dose, frequency) ?If on medication, Last dose was at least 1-2 hours prior to recheck: Yes ?Blood pressure was taken in the left arm after patient rested for 2 minutes.  ?There were no vitals taken for this visit. ? ?[name] ? ?  ?

## 2022-01-26 ENCOUNTER — Ambulatory Visit
Admission: RE | Admit: 2022-01-26 | Discharge: 2022-01-26 | Disposition: A | Discharge status: Home / Self Care | Payer: Medicare HMO | Source: Ambulatory Visit | Attending: Family Medicine | Admitting: Family Medicine

## 2022-01-26 DIAGNOSIS — Z1231 Encounter for screening mammogram for malignant neoplasm of breast: Secondary | ICD-10-CM

## 2022-02-06 ENCOUNTER — Ambulatory Visit (AMBULATORY_SURGERY_CENTER): Payer: Medicare HMO | Admitting: *Deleted

## 2022-02-06 VITALS — Ht 61.0 in | Wt 120.0 lb

## 2022-02-06 DIAGNOSIS — Z1211 Encounter for screening for malignant neoplasm of colon: Secondary | ICD-10-CM

## 2022-02-06 MED ORDER — NA SULFATE-K SULFATE-MG SULF 17.5-3.13-1.6 GM/177ML PO SOLN: 1.0000 | Freq: Once | ORAL | 0 refills | Status: AC

## 2022-02-06 NOTE — Progress Notes (Signed)
No egg or soy allergy known to patient  ?No issues known to pt with past sedation with any surgeries or procedures ?Patient denies ever being told they had issues or difficulty with intubation  ?No FH of Malignant Hyperthermia ?Pt is not on diet pills ?Pt is not on  home 02  ?Pt is not on blood thinners  ?Pt denies issues with constipation  ?No A fib or A flutter ? ?NO PA's for preps discussed with pt In PV today  ?Discussed with pt there will be an out-of-pocket cost for prep and that varies from $0 to 70 +  dollars - pt verbalized understanding  ?Pt instructed to use Singlecare.com or GoodRx for a price reduction on prep  ? ?PV completed over the phone. Pt verified name, DOB, address and insurance during PV today.  ?Pt mailed instruction packet with copy of consent form to read and not return, and instructions.  ?Pt encouraged to call with questions or issues.  ? ?Insurance confirmed with pt at [facility] today  ? ?

## 2022-02-12 ENCOUNTER — Telehealth: Payer: Self-pay | Admitting: Gastroenterology

## 2022-02-12 NOTE — Telephone Encounter (Signed)
Pt states the suprep is $125 at [facility]  ?Pt doesn't want to drink Golytely  ?Will do Plenvu sample- pt will come pick up prep sample and instructions today 2nd floor  WE WENT OVER instructions over the phone today  ?  LOT [id number]  EXP 12-2022 ?

## 2022-02-12 NOTE — Telephone Encounter (Signed)
Patient called stating she went to the pharmacy to pick up her prep and it cost around $125 and she can't afford it.  Is there something less expensive she can take?  Please call patient and advise.  Thank you. ?

## 2022-02-13 DIAGNOSIS — H524 Presbyopia: Secondary | ICD-10-CM | POA: Diagnosis not present

## 2022-02-13 DIAGNOSIS — H2512 Age-related nuclear cataract, left eye: Secondary | ICD-10-CM | POA: Diagnosis not present

## 2022-02-13 DIAGNOSIS — H5213 Myopia, bilateral: Secondary | ICD-10-CM | POA: Diagnosis not present

## 2022-02-13 DIAGNOSIS — E119 Type 2 diabetes mellitus without complications: Secondary | ICD-10-CM | POA: Diagnosis not present

## 2022-02-13 DIAGNOSIS — H25012 Cortical age-related cataract, left eye: Secondary | ICD-10-CM | POA: Diagnosis not present

## 2022-02-13 DIAGNOSIS — H52223 Regular astigmatism, bilateral: Secondary | ICD-10-CM | POA: Diagnosis not present

## 2022-02-20 ENCOUNTER — Ambulatory Visit (AMBULATORY_SURGERY_CENTER): Payer: Medicare HMO | Admitting: Gastroenterology

## 2022-02-20 ENCOUNTER — Encounter: Payer: Self-pay | Admitting: Gastroenterology

## 2022-02-20 VITALS — BP 116/62 | HR 69 | Temp 97.3°F | Resp 14 | Ht 61.0 in | Wt 110.0 lb

## 2022-02-20 DIAGNOSIS — Z1211 Encounter for screening for malignant neoplasm of colon: Secondary | ICD-10-CM

## 2022-02-20 DIAGNOSIS — D120 Benign neoplasm of cecum: Secondary | ICD-10-CM | POA: Diagnosis not present

## 2022-02-20 DIAGNOSIS — K635 Polyp of colon: Secondary | ICD-10-CM | POA: Diagnosis not present

## 2022-02-20 MED ORDER — SODIUM CHLORIDE 0.9 % IV SOLN: 500.0000 mL | Freq: Once | INTRAVENOUS | Status: DC

## 2022-02-20 NOTE — Progress Notes (Signed)
Called to room to assist during endoscopic procedure.  Patient ID and intended procedure confirmed with present staff. Received instructions for my participation in the procedure from the performing physician.  

## 2022-02-20 NOTE — Patient Instructions (Signed)
Information on polyps and hemorrhoids given to you today. ? ?Await pathology results. ? ?Resume previous diet and medications. ? ? ?YOU HAD AN ENDOSCOPIC PROCEDURE TODAY AT [facility]:   Refer to the procedure report that was given to you for any specific questions about what was found during the examination.  If the procedure report does not answer your questions, please call your gastroenterologist to clarify.  If you requested that your care partner not be given the details of your procedure findings, then the procedure report has been included in a sealed envelope for you to review at your convenience later. ? ?YOU SHOULD EXPECT: Some feelings of bloating in the abdomen. Passage of more gas than usual.  Walking can help get rid of the air that was put into your GI tract during the procedure and reduce the bloating. If you had a lower endoscopy (such as a colonoscopy or flexible sigmoidoscopy) you may notice spotting of blood in your stool or on the toilet paper. If you underwent a bowel prep for your procedure, you may not have a normal bowel movement for a few days. ? ?Please Note:  You might notice some irritation and congestion in your nose or some drainage.  This is from the oxygen used during your procedure.  There is no need for concern and it should clear up in a day or so. ? ?SYMPTOMS TO REPORT IMMEDIATELY: ? ?Following lower endoscopy (colonoscopy or flexible sigmoidoscopy): ? Excessive amounts of blood in the stool ? Significant tenderness or worsening of abdominal pains ? Swelling of the abdomen that is new, acute ? Fever of 100?F or higher ? ? ?For urgent or emergent issues, a gastroenterologist can be reached at any hour by calling [phone]. ?Do not use MyChart messaging for urgent concerns.  ? ? ?DIET:  We do recommend a small meal at first, but then you may proceed to your regular diet.  Drink plenty of fluids but you should avoid alcoholic beverages for 24  hours. ? ?ACTIVITY:  You should plan to take it easy for the rest of today and you should NOT DRIVE or use heavy machinery until tomorrow (because of the sedation medicines used during the test).   ? ?FOLLOW UP: ?Our staff will call the number listed on your records 48-72 hours following your procedure to check on you and address any questions or concerns that you may have regarding the information given to you following your procedure. If we do not reach you, we will leave a message.  We will attempt to reach you two times.  During this call, we will ask if you have developed any symptoms of COVID 19. If you develop any symptoms (ie: fever, flu-like symptoms, shortness of breath, cough etc.) before then, please call [phone].  If you test positive for Covid 19 in the 2 weeks post procedure, please call and report this information to [place].   ? ?If any biopsies were taken you will be contacted by phone or by letter within the next 1-3 weeks.  Please call us at [phone] if you have not heard about the biopsies in 3 weeks.  ? ? ?SIGNATURES/CONFIDENTIALITY: ?You and/or your care partner have signed paperwork which will be entered into your electronic medical record.  These signatures attest to the fact that that the information above on your After Visit Summary has been reviewed and is understood.  Full responsibility of the confidentiality of this discharge information lies with you and/or  your care-partner.  ?

## 2022-02-20 NOTE — Progress Notes (Signed)
Vitals-DT  Pt's states no medical or surgical changes since previsit or office visit.  

## 2022-02-20 NOTE — Progress Notes (Signed)
[facility] History and Physical ? ? ?Primary Care Physician:  Wendie Agreste, MD ? ? ?Reason for Procedure:  Colorectal cancer screening ? ?Plan:    Screening colonoscopy with possible interventions as needed ? ? ? ? ?HPI: [name] is a very pleasant 66 y.o. female here for screening colonoscopy. ?Denies any nausea, vomiting, abdominal pain, melena or bright red blood per rectum ? ?The risks and benefits as well as alternatives of endoscopic procedure(s) have been discussed and reviewed. All questions answered. The patient agrees to proceed. ? ? ? ?Past Medical History:  ?Diagnosis Date  ? Allergy   ? mild  ? Arthritis   ? L knee  ? Hyperlipidemia   ? Hypertension   ? on meds  ? Prediabetes   ? no meds- diet controlled  ? Seizures ([place])   ? seizures with pregnancy 33 yrs ago - none since  ? TBI (traumatic brain injury) ([place])   ? Memory issues improved  ? Vitamin D deficiency   ? ? ?Past Surgical History:  ?Procedure Laterality Date  ? ABDOMINAL HYSTERECTOMY    ? BSO  ? BREAST BIOPSY Left   ? COLONOSCOPY  01/13/2008  ? normal  ? KNEE ARTHROSCOPY Right   ? ? ?Prior to Admission medications   ?Medication Sig Start Date End Date Taking? Authorizing Provider  ?Cholecalciferol (VITAMIN D PO) Take 5,000 Int'l Units by mouth daily.   Yes [provider]  ?irbesartan-hydrochlorothiazide (AVALIDE) 300-12.5 MG tablet Take 1 tablet by mouth daily. 12/20/21  Yes Maximiano Coss, NP  ?ACCU-CHEK AVIVA PLUS test strip Check readings once per week. 11/28/20   Wendie Agreste, MD  ?Accu-Chek Softclix Lancets lancets Check reading once per week, 11/28/20   Wendie Agreste, MD  ?Alcohol Swabs (B-D SINGLE USE SWABS REGULAR) PADS  12/18/19   [provider]  ?Blood Glucose Monitoring Suppl (ACCU-CHEK AVIVA PLUS) w/Device KIT  12/18/19   [provider]  ?fluticasone (FLONASE) 50 MCG/ACT nasal spray Place 1 spray into both nostrils daily. 11/28/20   Wendie Agreste, MD  ? ? ?Current  Outpatient Medications  ?Medication Sig Dispense Refill  ? Cholecalciferol (VITAMIN D PO) Take 5,000 Int'l Units by mouth daily.    ? irbesartan-hydrochlorothiazide (AVALIDE) 300-12.5 MG tablet Take 1 tablet by mouth daily. 90 tablet 0  ? ACCU-CHEK AVIVA PLUS test strip Check readings once per week. 100 each 0  ? Accu-Chek Softclix Lancets lancets Check reading once per week, 100 each 0  ? Alcohol Swabs (B-D SINGLE USE SWABS REGULAR) PADS     ? Blood Glucose Monitoring Suppl (ACCU-CHEK AVIVA PLUS) w/Device KIT     ? fluticasone (FLONASE) 50 MCG/ACT nasal spray Place 1 spray into both nostrils daily. 16 g 6  ? ?Current Facility-Administered Medications  ?Medication Dose Route Frequency Provider Last Rate Last Admin  ? 0.9 %  sodium chloride infusion  500 mL Intravenous Once Lucilla Petrenko, Venia Minks, MD      ? ? ?Allergies as of 02/20/2022  ? (No Known Allergies)  ? ? ?Family History  ?Problem Relation Age of Onset  ? Hypertension Mother   ? Diabetes Mother   ? Heart disease Father   ? Cancer Brother   ? Kidney disease Other   ? Colon cancer Neg Hx   ? Colon polyps Neg Hx   ? Esophageal cancer Neg Hx   ? Rectal cancer Neg Hx   ? Stomach cancer Neg Hx   ? ? ?Social History  ? ?  Socioeconomic History  ? Marital status: Divorced  ?  Spouse name: Not on file  ? Number of children: Not on file  ? Years of education: Not on file  ? Highest education level: Not on file  ?Occupational History  ? Not on file  ?Tobacco Use  ? Smoking status: Never  ? Smokeless tobacco: Never  ?Vaping Use  ? Vaping Use: Never used  ?Substance and Sexual Activity  ? Alcohol use: No  ? Drug use: No  ? Sexual activity: Not on file  ?Other Topics Concern  ? Not on file  ?Social History Narrative  ? Not on file  ? ?Social Determinants of Health  ? ?Financial Resource Strain: Not on file  ?Food Insecurity: Not on file  ?Transportation Needs: Not on file  ?Physical Activity: Not on file  ?Stress: Not on file  ?Social Connections: Not on file  ?Intimate  Partner Violence: Not on file  ? ? ?Review of Systems: ? ?All other review of systems negative except as mentioned in the HPI. ? ?Physical Exam: ?Vital signs in last 24 hours: ?BP 105/61 (BP Location: Right Arm, Patient Position: Sitting, Cuff Size: Normal)   Temp (!) 97.3 ?F (36.3 ?C) (Temporal)   Ht 5' 1"  (1.549 m)   Wt 110 lb (49.9 kg)   SpO2 97%   BMI 20.78 kg/m?  ?General:   Alert, NAD ?Lungs:  Clear .   ?Heart:  Regular rate and rhythm ?Abdomen:  Soft, nontender and nondistended. ?Neuro/Psych:  Alert and cooperative. Normal mood and affect. A and O x 3 ? ?Reviewed labs, radiology imaging, old records and pertinent past GI work up ? ?Patient is appropriate for planned procedure(s) and anesthesia in an ambulatory setting ? ? ?K. Denzil Magnuson , MD ?[phone]  ? ? ?  ?

## 2022-02-20 NOTE — Op Note (Signed)
[place] ?Patient Name: [name] ?Procedure Date: 02/20/2022 10:12 AM ?MRN: [id number] ?Endoscopist: Mauri Pole , MD ?Age: 66 ?Referring MD:  ?Date of Birth: [birth year] ?Gender: Female ?Account #: [id number] ?Procedure:                Colonoscopy ?Indications:              Screening for colorectal malignant neoplasm ?Medicines:                Monitored Anesthesia Care ?Procedure:                Pre-Anesthesia Assessment: ?                          - Prior to the procedure, a History and Physical  ?                          was performed, and patient medications and  ?                          allergies were reviewed. The patient's tolerance of  ?                          previous anesthesia was also reviewed. The risks  ?                          and benefits of the procedure and the sedation  ?                          options and risks were discussed with the patient.  ?                          All questions were answered, and informed consent  ?                          was obtained. Prior Anticoagulants: The patient has  ?                          taken no previous anticoagulant or antiplatelet  ?                          agents. ASA Grade Assessment: II - A patient with  ?                          mild systemic disease. After reviewing the risks  ?                          and benefits, the patient was deemed in  ?                          satisfactory condition to undergo the procedure. ?                          After obtaining informed consent, the colonoscope  ?  was passed under direct vision. Throughout the  ?                          procedure, the patient's blood pressure, pulse, and  ?                          oxygen saturations were monitored continuously. The  ?                          PCF-HQ190L Colonoscope was introduced through the  ?                          anus and advanced to the the cecum, identified by  ?                          appendiceal  orifice and ileocecal valve. The  ?                          colonoscopy was performed without difficulty. The  ?                          patient tolerated the procedure well. The quality  ?                          of the bowel preparation was excellent. The  ?                          ileocecal valve, appendiceal orifice, and rectum  ?                          were photographed. ?Scope In: 10:20:58 AM ?Scope Out: 10:45:45 AM ?Scope Withdrawal Time: 0 hours 15 minutes 14 seconds  ?Total Procedure Duration: 0 hours 24 minutes 47 seconds  ?Findings:                 The perianal and digital rectal examinations were  ?                          normal. ?                          A 12 mm polyp was found in the appendiceal orifice.  ?                          The polyp was sessile. The polyp was removed with a  ?                          piecemeal technique using a cold snare. Resection  ?                          and retrieval were complete but cannot determine if  ?                          polyp tissue was extending deep into the appendix. ?  Non-bleeding external and internal hemorrhoids were  ?                          found during retroflexion. The hemorrhoids were  ?                          medium-sized. ?Complications:            No immediate complications. ?Estimated Blood Loss:     Estimated blood loss was minimal. ?Impression:               - One 12 mm polyp at the appendiceal orifice,  ?                          removed piecemeal using a cold snare. Resected and  ?                          retrieved. ?                          - Non-bleeding external and internal hemorrhoids. ?Recommendation:           - Patient has a contact number available for  ?                          emergencies. The signs and symptoms of potential  ?                          delayed complications were discussed with the  ?                          patient. Return to normal activities tomorrow.  ?                           Written discharge instructions were provided to the  ?                          patient. ?                          - Resume previous diet. ?                          - Continue present medications. ?                          - Await pathology results. ?                          - Repeat colonoscopy in 3 years for surveillance  ?                          based on pathology results. ?                          - Refer to a colo-rectal surgeon at appointment to  ?  be scheduled. ?Mauri Pole, MD ?02/20/2022 10:57:04 AM ?This report has been signed electronically. ?

## 2022-02-20 NOTE — Progress Notes (Signed)
Report to pacu rn; vss. ?

## 2022-02-22 ENCOUNTER — Telehealth: Payer: Self-pay

## 2022-02-22 NOTE — Telephone Encounter (Signed)
?  Follow up Call- ? ? ?  02/20/2022  ?  9:43 AM  ?Call back number  ?Post procedure Call Back phone  # [phone]  ?Permission to leave phone message Yes  ?  ? ?Patient questions: ? ?Do you have a fever, pain , or abdominal swelling? No. ?Pain Score  0 * ? ?Have you tolerated food without any problems? Yes.   ? ?Have you been able to return to your normal activities? Yes.   ? ?Do you have any questions about your discharge instructions: ?Diet   No. ?Medications  No. ?Follow up visit  No. ? ?Do you have questions or concerns about your Care? No. ? ?Actions: ?* If pain score is 4 or above: ?No action needed, pain <4. ? ? ?

## 2022-02-23 ENCOUNTER — Encounter: Payer: Self-pay | Admitting: Gastroenterology

## 2022-02-28 ENCOUNTER — Ambulatory Visit: Payer: Self-pay | Admitting: General Surgery

## 2022-02-28 DIAGNOSIS — K635 Polyp of colon: Secondary | ICD-10-CM | POA: Diagnosis not present

## 2022-02-28 NOTE — H&P (View-Only) (Signed)
  REFERRING PHYSICIAN:  Ree Shay, MD  PROVIDER:  Monico Blitz, MD  MRN: [id number] DOB: [birth year] DATE OF ENCOUNTER: 02/28/2022  Subjective   Chief Complaint: New Patient     History of Present Illness: [name] is a 66 y.o. female who is seen today as an office consultation at the request of Dr. Sheppard Penton for evaluation of New Patient .  Patient underwent a screening colonoscopy recently.  She was noted to have an appendiceal orifice polyp approximately 12 mm in size.  She underwent resection and retrieval of this.  Final pathology showed sessile serrated polyp without dysplasia.  There is concerned that this may not be completely resected due to polyp growing into the appendix itself.  Surgical history significant for abdominal hysterectomy and c section.   Review of Systems: A complete review of systems was obtained from the patient.  I have reviewed this information and discussed as appropriate with the patient.  See HPI as well for other ROS.  Medical History: Past Medical History:  Diagnosis Date   Arthritis    Hypertension     There is no problem list on file for this patient.   History reviewed. No pertinent surgical history.   No Known Allergies  Current Outpatient Medications on File Prior to Visit  Medication Sig Dispense Refill   irbesartan-hydroCHLOROthiazide (AVALIDE) 300-12.5 mg tablet Take 1 tablet by mouth once daily     cholecalciferol (VITAMIN D3) 2,000 unit tablet Take by mouth     omega-3s/dha/epa/fish oil/D3 (VITAMIN-D + OMEGA-3 ORAL) Take by mouth     No current facility-administered medications on file prior to visit.    Family History  Problem Relation Age of Onset   High blood pressure (Hypertension) Mother    Hyperlipidemia (Elevated cholesterol) Mother    Diabetes Mother    Obesity Brother      Social History   Tobacco Use  Smoking Status Never  Smokeless Tobacco Never     Social History    Socioeconomic History   Marital status: Unknown  Tobacco Use   Smoking status: Never   Smokeless tobacco: Never  Substance and Sexual Activity   Alcohol use: Never   Drug use: Never    Objective:    Vitals:   02/28/22 1328  BP: 132/80  Pulse: 89  Temp: 36.7 C (98 F)  SpO2: 96%  Weight: 60.3 kg (133 lb)  Height: 157.5 cm ('5\' 2"'$ )     Exam Gen: NAD CV: RRR Lungs: CTA Abd: Infraumbilical scar, Pfannenstiel scar noted    Labs, Imaging and Diagnostic Testing: Colonoscopy report and pathology report reviewed.  Assessment and Plan:  There are no diagnoses linked to this encounter.   66 year old female who underwent a colonoscopy recently, which showed a sessile serrated polyp at the appendiceal orifice.  It is unclear whether this was completely resected due to its location.  I have recommended proceeding with a laparoscopic appendectomy with extended resection into the cecum to make sure that we have clear margins.  We have discussed this in detail including risk of bleeding, damage to adjacent structures, infection, wound problems, staple line leak or difficulty with anesthesia.  All questions were answered.  The patient wishes to proceed.  We will schedule this at her convenience.  Rosario Adie, MD Colon and Rectal Surgery [facility]

## 2022-02-28 NOTE — H&P (Signed)
?  REFERRING PHYSICIAN:  Ree Shay, MD ? ?PROVIDER:  Monico Blitz, MD ? ?MRN: [id number] ?DOB: 1956-10-02 ?DATE OF ENCOUNTER: 02/28/2022 ? ?Subjective  ? ?Chief Complaint: New Patient ?  ? ? ?History of Present Illness: ?[name] is a 66 y.o. female who is seen today as an office consultation at the request of Dr. Sheppard Penton for evaluation of New Patient ?Marland Kitchen  Patient underwent a screening colonoscopy recently.  She was noted to have an appendiceal orifice polyp approximately 12 mm in size.  She underwent resection and retrieval of this.  Final pathology showed sessile serrated polyp without dysplasia.  There is concerned that this may not be completely resected due to polyp growing into the appendix itself.  Surgical history significant for abdominal hysterectomy and c section. ? ? ?Review of Systems: ?A complete review of systems was obtained from the patient.  I have reviewed this information and discussed as appropriate with the patient.  See HPI as well for other ROS. ? ?Medical History: ?Past Medical History:  ?Diagnosis Date  ? Arthritis   ? Hypertension   ? ? ?There is no problem list on file for this patient. ? ? ?History reviewed. No pertinent surgical history.  ? ?No Known Allergies ? ?Current Outpatient Medications on File Prior to Visit  ?Medication Sig Dispense Refill  ? irbesartan-hydroCHLOROthiazide (AVALIDE) 300-12.5 mg tablet Take 1 tablet by mouth once daily    ? cholecalciferol (VITAMIN D3) 2,000 unit tablet Take by mouth    ? omega-3s/dha/epa/fish oil/D3 (VITAMIN-D + OMEGA-3 ORAL) Take by mouth    ? ?No current facility-administered medications on file prior to visit.  ? ? ?Family History  ?Problem Relation Age of Onset  ? High blood pressure (Hypertension) Mother   ? Hyperlipidemia (Elevated cholesterol) Mother   ? Diabetes Mother   ? Obesity Brother   ?  ? ?Social History  ? ?Tobacco Use  ?Smoking Status Never  ?Smokeless Tobacco Never  ?  ? ?Social History   ? ?Socioeconomic History  ? Marital status: Unknown  ?Tobacco Use  ? Smoking status: Never  ? Smokeless tobacco: Never  ?Substance and Sexual Activity  ? Alcohol use: Never  ? Drug use: Never  ? ? ?Objective:  ? ? ?Vitals:  ? 02/28/22 1328  ?BP: 132/80  ?Pulse: 89  ?Temp: 36.7 ?C (98 ?F)  ?SpO2: 96%  ?Weight: 60.3 kg (133 lb)  ?Height: 157.5 cm ('5\' 2"'$ )  ?  ? ?Exam ?Gen: NAD ?CV: RRR ?Lungs: CTA ?Abd: Infraumbilical scar, Pfannenstiel scar noted ? ? ? ?Labs, Imaging and Diagnostic Testing: ?Colonoscopy report and pathology report reviewed. ? ?Assessment and Plan:  ?There are no diagnoses linked to this encounter.  ? ?66 year old female who underwent a colonoscopy recently, which showed a sessile serrated polyp at the appendiceal orifice.  It is unclear whether this was completely resected due to its location.  I have recommended proceeding with a laparoscopic appendectomy with extended resection into the cecum to make sure that we have clear margins.  We have discussed this in detail including risk of bleeding, damage to adjacent structures, infection, wound problems, staple line leak or difficulty with anesthesia.  All questions were answered.  The patient wishes to proceed.  We will schedule this at her convenience. ? ?Rosario Adie, MD ?Colon and Rectal Surgery ?[facility]  ?

## 2022-03-02 ENCOUNTER — Other Ambulatory Visit: Payer: Self-pay

## 2022-03-02 ENCOUNTER — Encounter (HOSPITAL_COMMUNITY)
Admission: RE | Admit: 2022-03-02 | Discharge: 2022-03-02 | Disposition: A | Discharge status: Home / Self Care | Payer: Medicare HMO | Source: Ambulatory Visit | Attending: General Surgery | Admitting: General Surgery

## 2022-03-02 ENCOUNTER — Encounter (HOSPITAL_COMMUNITY): Payer: Self-pay

## 2022-03-02 DIAGNOSIS — Z01818 Encounter for other preprocedural examination: Secondary | ICD-10-CM | POA: Insufficient documentation

## 2022-03-02 DIAGNOSIS — R7303 Prediabetes: Secondary | ICD-10-CM | POA: Diagnosis not present

## 2022-03-02 HISTORY — DX: Prediabetes: R73.03

## 2022-03-02 LAB — BASIC METABOLIC PANEL
Anion gap: 7 (ref 5–15)
BUN: 21 mg/dL (ref 8–23)
CO2: 29 mmol/L (ref 22–32)
Calcium: 9.4 mg/dL (ref 8.9–10.3)
Chloride: 105 mmol/L (ref 98–111)
Creatinine, Ser: 0.69 mg/dL (ref 0.44–1.00)
GFR, Estimated: >60 mL/min (ref >60)
Glucose, Bld: 96 mg/dL (ref 70–99)
Potassium: 3.7 mmol/L (ref 3.5–5.1)
Sodium: 141 mmol/L (ref 135–145)

## 2022-03-02 LAB — GLUCOSE, CAPILLARY: Glucose-Capillary: 96 mg/dL (ref 70–99)

## 2022-03-02 LAB — CBC
HCT: 41.4 % (ref 36.0–46.0)
Hemoglobin: 13.3 g/dL (ref 12.0–15.0)
MCH: 30.9 pg (ref 26.0–34.0)
MCHC: 32.1 g/dL (ref 30.0–36.0)
MCV: 96.1 fL (ref 80.0–100.0)
Platelets: 170 10*3/uL (ref 150–400)
RBC: 4.31 MIL/uL (ref 3.87–5.11)
RDW: 11.9 % (ref 11.5–15.5)
WBC: 3.5 10*3/uL — ABNORMAL LOW (ref 4.0–10.5)
nRBC: 0 % (ref 0.0–0.2)

## 2022-03-02 LAB — HEMOGLOBIN A1C
Hgb A1c MFr Bld: 6.2 % — ABNORMAL HIGH (ref 4.8–5.6)
Mean Plasma Glucose: 131.24 mg/dL

## 2022-03-02 NOTE — Progress Notes (Addendum)
Anesthesia Review:  PCP: DR Merri Ray- LOV 01/03/22  Cardiologist : Dixie Dials Called and requested lov note on 03/05/22 once office was open.   Chest x-ray : EKG : 03/02/2022  Echo : Stress test: Cardiac Cath :  Activity level: can do a flgiht of stairs without difficuilty  Sleep Study/ CPAP : none  Fasting Blood Sugar :      / Checks Blood Sugar -- times a day:   Blood Thinner/ Instructions /Last Dose: ASA / Instructions/ Last Dose :   DM- prediabetes Hgba1c- 03/02/22-  6.2  Checks glucose every other day at home  PT was followed by Dr Doylene Canard more than a year ago .  Attempted to call for records on 03/02/2022.  Office clsoed.  Will call back on 03/05/22 for lov, etc.

## 2022-03-02 NOTE — Progress Notes (Signed)
DUE TO COVID-19 ONLY  2  VISITOR IS ALLOWED TO COME WITH YOU AND STAY IN THE WAITING ROOM ONLY DURING PRE OP AND PROCEDURE DAY OF SURGERY.  2 4 VISITOR  MAY VISIT WITH YOU AFTER SURGERY IN YOUR PRIVATE ROOM DURING VISITING HOURS ONLY! YOU MAY HAVE ONE PERSON SPEND THE NITE WITH YOU IN YOUR ROOM AFTER SURGERY.      Your procedure is scheduled on:         03/07/22   Report to [facility] Main  Entrance   Report to admitting at     0900am            AM DO NOT BRING INSURANCE CARD, PICTURE ID OR WALLET DAY OF SURGERY.      Call this number if you have problems the morning of surgery [phone]    REMEMBER: NO  SOLID FOODS , CANDY, GUM OR MINTS AFTER [place] .      Drink 2 g2 lower sugar drinks at 1000pm the nite before surgery.    Marland Kitchen CLEAR LIQUIDS UNTIL   0815am              DAY OF SURGERY.      PLEASE FINISH  G2 Lower sugar DRINK PER SURGEON ORDER  WHICH NEEDS TO BE COMPLETED AT   0815am       MORNING OF SURGERY.       CLEAR LIQUID DIET   Foods Allowed      WATER BLACK COFFEE ( SUGAR OK, NO MILK, CREAM OR CREAMER) REGULAR AND DECAF  TEA ( SUGAR OK NO MILK, CREAM, OR CREAMER) REGULAR AND DECAF  PLAIN JELLO ( NO RED)  FRUIT ICES ( NO RED, NO FRUIT PULP)  POPSICLES ( NO RED)  JUICE- APPLE, WHITE GRAPE AND WHITE CRANBERRY  SPORT DRINK LIKE GATORADE ( NO RED)  CLEAR BROTH ( VEGETABLE , CHICKEN OR BEEF)                                                                     BRUSH YOUR TEETH MORNING OF SURGERY AND RINSE YOUR MOUTH OUT, NO CHEWING GUM CANDY OR MINTS.     Take these medicines the morning of surgery with A SIP OF WATER:  none    DO NOT TAKE ANY DIABETIC MEDICATIONS DAY OF YOUR SURGERY                               You may not have any metal on your body including hair pins and              piercings  Do not wear jewelry, make-up, lotions, powders or perfumes, deodorant             Do not wear nail polish on your fingernails.               IF YOU ARE A FEMALE AND WANT TO SHAVE UNDER ARMS OR LEGS PRIOR TO SURGERY YOU MUST DO SO AT LEAST 48 HOURS PRIOR TO SURGERY.              Men may shave face and neck.   Do not bring  valuables to the hospital. [place].  Contacts, dentures or bridgework may not be worn into surgery.  Leave suitcase in the car. After surgery it may be brought to your room.     Patients discharged the day of surgery will not be allowed to drive home. IF YOU ARE HAVING SURGERY AND GOING HOME THE SAME DAY, YOU MUST HAVE AN ADULT TO DRIVE YOU HOME AND BE WITH YOU FOR 24 HOURS. YOU MAY GO HOME BY TAXI OR UBER OR ORTHERWISE, BUT AN ADULT MUST ACCOMPANY YOU HOME AND STAY WITH YOU FOR 24 HOURS.                Please read over the following fact sheets you were given: _____________________________________________________________________  [facility] - Preparing for Surgery Before surgery, you can play an important role.  Because skin is not sterile, your skin needs to be as free of germs as possible.  You can reduce the number of germs on your skin by washing with CHG (chlorahexidine gluconate) soap before surgery.  CHG is an antiseptic cleaner which kills germs and bonds with the skin to continue killing germs even after washing. Please DO NOT use if you have an allergy to CHG or antibacterial soaps.  If your skin becomes reddened/irritated stop using the CHG and inform your nurse when you arrive at Short Stay. Do not shave (including legs and underarms) for at least 48 hours prior to the first CHG shower.  You may shave your face/neck. Please follow these instructions carefully:  1.  Shower with CHG Soap the night before surgery and the  morning of Surgery.  2.  If you choose to wash your hair, wash your hair first as usual with your  normal  shampoo.  3.  After you shampoo, rinse your hair and body thoroughly to remove the  shampoo.                           4.  Use  CHG as you would any other liquid soap.  You can apply chg directly  to the skin and wash                       Gently with a scrungie or clean washcloth.  5.  Apply the CHG Soap to your body ONLY FROM THE NECK DOWN.   Do not use on face/ open                           Wound or open sores. Avoid contact with eyes, ears mouth and genitals (private parts).                       Wash face,  Genitals (private parts) with your normal soap.             6.  Wash thoroughly, paying special attention to the area where your surgery  will be performed.  7.  Thoroughly rinse your body with warm water from the neck down.  8.  DO NOT shower/wash with your normal soap after using and rinsing off  the CHG Soap.                9.  Pat yourself dry with a clean towel.  10.  Wear clean pajamas.            11.  Place clean sheets on your bed the night of your first shower and do not  sleep with pets. Day of Surgery : Do not apply any lotions/deodorants the morning of surgery.  Please wear clean clothes to the hospital/surgery center.  FAILURE TO FOLLOW THESE INSTRUCTIONS MAY RESULT IN THE CANCELLATION OF YOUR SURGERY PATIENT SIGNATURE_________________________________  NURSE SIGNATURE__________________________________  ________________________________________________________________________

## 2022-03-07 ENCOUNTER — Ambulatory Visit (HOSPITAL_COMMUNITY)
Admission: RE | Admit: 2022-03-07 | Discharge: 2022-03-08 | Disposition: A | Discharge status: Home / Self Care | Payer: Medicare HMO | Attending: General Surgery | Admitting: General Surgery

## 2022-03-07 ENCOUNTER — Ambulatory Visit (HOSPITAL_BASED_OUTPATIENT_CLINIC_OR_DEPARTMENT_OTHER): Payer: Medicare HMO | Admitting: Certified Registered Nurse Anesthetist

## 2022-03-07 ENCOUNTER — Other Ambulatory Visit: Payer: Self-pay

## 2022-03-07 ENCOUNTER — Ambulatory Visit (HOSPITAL_COMMUNITY): Payer: Medicare HMO | Admitting: Physician Assistant

## 2022-03-07 ENCOUNTER — Encounter (HOSPITAL_COMMUNITY): Payer: Self-pay | Admitting: General Surgery

## 2022-03-07 ENCOUNTER — Encounter (HOSPITAL_COMMUNITY)
Admission: RE | Disposition: A | Discharge status: Home / Self Care | Payer: Self-pay | Source: Home / Self Care | Attending: General Surgery

## 2022-03-07 DIAGNOSIS — D120 Benign neoplasm of cecum: Secondary | ICD-10-CM | POA: Insufficient documentation

## 2022-03-07 DIAGNOSIS — E119 Type 2 diabetes mellitus without complications: Secondary | ICD-10-CM | POA: Diagnosis not present

## 2022-03-07 DIAGNOSIS — I10 Essential (primary) hypertension: Secondary | ICD-10-CM | POA: Diagnosis not present

## 2022-03-07 DIAGNOSIS — K635 Polyp of colon: Secondary | ICD-10-CM | POA: Diagnosis not present

## 2022-03-07 DIAGNOSIS — R7303 Prediabetes: Secondary | ICD-10-CM

## 2022-03-07 DIAGNOSIS — D121 Benign neoplasm of appendix: Secondary | ICD-10-CM | POA: Diagnosis not present

## 2022-03-07 DIAGNOSIS — K388 Other specified diseases of appendix: Secondary | ICD-10-CM | POA: Diagnosis not present

## 2022-03-07 HISTORY — PX: LAPAROSCOPIC APPENDECTOMY: SHX408

## 2022-03-07 LAB — GLUCOSE, CAPILLARY: Glucose-Capillary: 83 mg/dL (ref 70–99)

## 2022-03-07 SURGERY — APPENDECTOMY, LAPAROSCOPIC
Anesthesia: General | Site: Abdomen

## 2022-03-07 MED ORDER — IRBESARTAN 150 MG PO TABS
300.0000 mg | ORAL_TABLET | Freq: Every day | ORAL | Status: DC
Start: 2022-03-07 — End: 2022-03-08
  Administered 2022-03-07 – 2022-03-08 (×2): 300 mg via ORAL
  Filled 2022-03-07 (×2): qty 2

## 2022-03-07 MED ORDER — PROPOFOL 10 MG/ML IV BOLUS
INTRAVENOUS | Status: DC | PRN
  Administered 2022-03-07: 130 mg via INTRAVENOUS

## 2022-03-07 MED ORDER — GABAPENTIN 300 MG PO CAPS
300.0000 mg | ORAL_CAPSULE | ORAL | Status: AC
  Administered 2022-03-07: 300 mg via ORAL
  Filled 2022-03-07: qty 1

## 2022-03-07 MED ORDER — ROCURONIUM BROMIDE 10 MG/ML (PF) SYRINGE
PREFILLED_SYRINGE | INTRAVENOUS | Status: DC | PRN
  Administered 2022-03-07: 70 mg via INTRAVENOUS

## 2022-03-07 MED ORDER — DIPHENHYDRAMINE HCL 12.5 MG/5ML PO ELIX: 12.5000 mg | ORAL_SOLUTION | Freq: Four times a day (QID) | ORAL | Status: DC | PRN

## 2022-03-07 MED ORDER — BUPIVACAINE LIPOSOME 1.3 % IJ SUSP: 20.0000 mL | Freq: Once | INTRAMUSCULAR | Status: DC

## 2022-03-07 MED ORDER — DOCUSATE SODIUM 100 MG PO CAPS
100.0000 mg | ORAL_CAPSULE | Freq: Two times a day (BID) | ORAL | Status: DC
  Administered 2022-03-07 – 2022-03-08 (×2): 100 mg via ORAL
  Filled 2022-03-07 (×2): qty 1

## 2022-03-07 MED ORDER — OXYCODONE HCL 5 MG/5ML PO SOLN: 5.0000 mg | Freq: Once | ORAL | Status: DC | PRN

## 2022-03-07 MED ORDER — ALVIMOPAN 12 MG PO CAPS
12.0000 mg | ORAL_CAPSULE | ORAL | Status: AC
  Administered 2022-03-07: 12 mg via ORAL
  Filled 2022-03-07: qty 1

## 2022-03-07 MED ORDER — FENTANYL CITRATE PF 50 MCG/ML IJ SOSY
25.0000 ug | PREFILLED_SYRINGE | INTRAMUSCULAR | Status: DC | PRN
  Administered 2022-03-07: 50 ug via INTRAVENOUS

## 2022-03-07 MED ORDER — SODIUM CHLORIDE 0.9 % IV SOLN: INTRAVENOUS | Status: DC

## 2022-03-07 MED ORDER — OXYCODONE HCL 5 MG PO TABS: 5.0000 mg | ORAL_TABLET | Freq: Once | ORAL | Status: DC | PRN

## 2022-03-07 MED ORDER — MIDAZOLAM HCL 2 MG/2ML IJ SOLN
INTRAMUSCULAR | Status: AC
  Filled 2022-03-07: qty 2

## 2022-03-07 MED ORDER — ENSURE PRE-SURGERY PO LIQD: 592.0000 mL | Freq: Once | ORAL | Status: DC

## 2022-03-07 MED ORDER — FENTANYL CITRATE (PF) 100 MCG/2ML IJ SOLN
INTRAMUSCULAR | Status: AC
Start: 2022-03-07 — End: ?
  Filled 2022-03-07: qty 2

## 2022-03-07 MED ORDER — FENTANYL CITRATE PF 50 MCG/ML IJ SOSY
PREFILLED_SYRINGE | INTRAMUSCULAR | Status: AC
  Filled 2022-03-07: qty 1

## 2022-03-07 MED ORDER — ONDANSETRON 4 MG PO TBDP: 4.0000 mg | ORAL_TABLET | Freq: Four times a day (QID) | ORAL | Status: DC | PRN

## 2022-03-07 MED ORDER — CHLORHEXIDINE GLUCONATE 0.12 % MT SOLN
15.0000 mL | Freq: Once | OROMUCOSAL | Status: AC
  Administered 2022-03-07: 15 mL via OROMUCOSAL

## 2022-03-07 MED ORDER — AMISULPRIDE (ANTIEMETIC) 5 MG/2ML IV SOLN: 10.0000 mg | Freq: Once | INTRAVENOUS | Status: DC | PRN

## 2022-03-07 MED ORDER — ONDANSETRON HCL 4 MG/2ML IJ SOLN
INTRAMUSCULAR | Status: DC | PRN
Start: 2022-03-07 — End: 2022-03-07
  Administered 2022-03-07: 4 mg via INTRAVENOUS

## 2022-03-07 MED ORDER — IRBESARTAN-HYDROCHLOROTHIAZIDE 300-12.5 MG PO TABS: 1.0000 | ORAL_TABLET | Freq: Every day | ORAL | Status: DC

## 2022-03-07 MED ORDER — ONDANSETRON HCL 4 MG/2ML IJ SOLN: 4.0000 mg | Freq: Once | INTRAMUSCULAR | Status: DC | PRN

## 2022-03-07 MED ORDER — MORPHINE SULFATE (PF) 2 MG/ML IV SOLN: 2.0000 mg | INTRAVENOUS | Status: DC | PRN

## 2022-03-07 MED ORDER — DIPHENHYDRAMINE HCL 50 MG/ML IJ SOLN: 12.5000 mg | Freq: Four times a day (QID) | INTRAMUSCULAR | Status: DC | PRN

## 2022-03-07 MED ORDER — ONDANSETRON HCL 4 MG/2ML IJ SOLN: 4.0000 mg | Freq: Four times a day (QID) | INTRAMUSCULAR | Status: DC | PRN

## 2022-03-07 MED ORDER — ACETAMINOPHEN 500 MG PO TABS: 1000.0000 mg | ORAL_TABLET | Freq: Once | ORAL | Status: DC

## 2022-03-07 MED ORDER — SODIUM CHLORIDE 0.9 % IV SOLN
2.0000 g | INTRAVENOUS | Status: AC
  Administered 2022-03-07: 2 g via INTRAVENOUS
  Filled 2022-03-07: qty 2

## 2022-03-07 MED ORDER — 0.9 % SODIUM CHLORIDE (POUR BTL) OPTIME
TOPICAL | Status: DC | PRN
  Administered 2022-03-07: 1000 mL

## 2022-03-07 MED ORDER — BUPIVACAINE-EPINEPHRINE (PF) 0.5% -1:200000 IJ SOLN
INTRAMUSCULAR | Status: AC
  Filled 2022-03-07: qty 30

## 2022-03-07 MED ORDER — BUPIVACAINE-EPINEPHRINE 0.5% -1:200000 IJ SOLN
INTRAMUSCULAR | Status: DC | PRN
  Administered 2022-03-07: 30 mL

## 2022-03-07 MED ORDER — ACETAMINOPHEN 500 MG PO TABS
1000.0000 mg | ORAL_TABLET | Freq: Four times a day (QID) | ORAL | Status: DC
  Administered 2022-03-07 – 2022-03-08 (×3): 1000 mg via ORAL
  Filled 2022-03-07 (×3): qty 2

## 2022-03-07 MED ORDER — ENSURE PRE-SURGERY PO LIQD: 296.0000 mL | Freq: Once | ORAL | Status: DC

## 2022-03-07 MED ORDER — MIDAZOLAM HCL 5 MG/5ML IJ SOLN
INTRAMUSCULAR | Status: DC | PRN
Start: 2022-03-07 — End: 2022-03-07
  Administered 2022-03-07: 2 mg via INTRAVENOUS

## 2022-03-07 MED ORDER — LIDOCAINE 2% (20 MG/ML) 5 ML SYRINGE
INTRAMUSCULAR | Status: DC | PRN
  Administered 2022-03-07: 60 mg via INTRAVENOUS

## 2022-03-07 MED ORDER — FENTANYL CITRATE (PF) 100 MCG/2ML IJ SOLN
INTRAMUSCULAR | Status: DC | PRN
  Administered 2022-03-07 (×2): 100 ug via INTRAVENOUS

## 2022-03-07 MED ORDER — LACTATED RINGERS IV SOLN: INTRAVENOUS | Status: DC

## 2022-03-07 MED ORDER — ENOXAPARIN SODIUM 40 MG/0.4ML IJ SOSY
40.0000 mg | PREFILLED_SYRINGE | INTRAMUSCULAR | Status: DC
  Administered 2022-03-08: 40 mg via SUBCUTANEOUS
  Filled 2022-03-07: qty 0.4

## 2022-03-07 MED ORDER — HYDROCHLOROTHIAZIDE 12.5 MG PO TABS
12.5000 mg | ORAL_TABLET | Freq: Every day | ORAL | Status: DC
  Administered 2022-03-07 – 2022-03-08 (×2): 12.5 mg via ORAL
  Filled 2022-03-07 (×2): qty 1

## 2022-03-07 MED ORDER — FENTANYL CITRATE (PF) 100 MCG/2ML IJ SOLN
INTRAMUSCULAR | Status: AC
  Filled 2022-03-07: qty 2

## 2022-03-07 MED ORDER — PROPOFOL 1000 MG/100ML IV EMUL
INTRAVENOUS | Status: AC
  Filled 2022-03-07: qty 100

## 2022-03-07 MED ORDER — ACETAMINOPHEN 500 MG PO TABS
1000.0000 mg | ORAL_TABLET | ORAL | Status: AC
  Administered 2022-03-07: 1000 mg via ORAL
  Filled 2022-03-07: qty 2

## 2022-03-07 MED ORDER — SUGAMMADEX SODIUM 200 MG/2ML IV SOLN
INTRAVENOUS | Status: DC | PRN
  Administered 2022-03-07: 120 mg via INTRAVENOUS

## 2022-03-07 MED ORDER — LACTATED RINGERS IR SOLN
Status: DC | PRN
  Administered 2022-03-07: 1000 mL

## 2022-03-07 MED ORDER — DEXAMETHASONE SODIUM PHOSPHATE 10 MG/ML IJ SOLN
INTRAMUSCULAR | Status: DC | PRN
  Administered 2022-03-07: 10 mg via INTRAVENOUS

## 2022-03-07 MED ORDER — ORAL CARE MOUTH RINSE: 15.0000 mL | Freq: Once | OROMUCOSAL | Status: AC

## 2022-03-07 MED ORDER — TRAMADOL HCL 50 MG PO TABS
50.0000 mg | ORAL_TABLET | Freq: Four times a day (QID) | ORAL | Status: DC | PRN
  Administered 2022-03-07: 50 mg via ORAL
  Filled 2022-03-07: qty 1

## 2022-03-07 SURGICAL SUPPLY — 50 items
ADH SKN CLS APL DERMABOND .7 (GAUZE/BANDAGES/DRESSINGS) ×1
APL PRP STRL LF DISP 70% ISPRP (MISCELLANEOUS) ×1
APPLIER CLIP 5 13 M/L LIGAMAX5 (MISCELLANEOUS)
APPLIER CLIP ROT 10 11.4 M/L (STAPLE)
APR CLP MED LRG 11.4X10 (STAPLE)
APR CLP MED LRG 5 ANG JAW (MISCELLANEOUS)
BAG COUNTER SPONGE SURGICOUNT (BAG) IMPLANT
BAG RETRIEVAL 10 (BASKET) ×1
BAG SPNG CNTER NS LX DISP (BAG)
CABLE HIGH FREQUENCY MONO STRZ (ELECTRODE) ×3 IMPLANT
CHLORAPREP W/TINT 26 (MISCELLANEOUS) ×3 IMPLANT
CLIP APPLIE 5 13 M/L LIGAMAX5 (MISCELLANEOUS) IMPLANT
CLIP APPLIE ROT 10 11.4 M/L (STAPLE) IMPLANT
DERMABOND ADVANCED (GAUZE/BANDAGES/DRESSINGS) ×1
DERMABOND ADVANCED .7 DNX12 (GAUZE/BANDAGES/DRESSINGS) ×2 IMPLANT
DRAPE LAPAROSCOPIC ABDOMINAL (DRAPES) IMPLANT
ELECT REM PT RETURN 15FT ADLT (MISCELLANEOUS) ×3 IMPLANT
GLOVE BIO SURGEON STRL SZ 6.5 (GLOVE) ×3 IMPLANT
GLOVE BIOGEL PI IND STRL 7.0 (GLOVE) ×2 IMPLANT
GLOVE BIOGEL PI INDICATOR 7.0 (GLOVE) ×1
GLOVE INDICATOR 6.5 STRL GRN (GLOVE) ×3 IMPLANT
GOWN STRL REUS W/ TWL XL LVL3 (GOWN DISPOSABLE) ×4 IMPLANT
GOWN STRL REUS W/TWL XL LVL3 (GOWN DISPOSABLE) ×4
GRASPER SUT TROCAR 14GX15 (MISCELLANEOUS) IMPLANT
HANDLE STAPLE EGIA 4 XL (STAPLE) ×1 IMPLANT
IRRIG SUCT STRYKERFLOW 2 WTIP (MISCELLANEOUS) ×2
IRRIGATION SUCT STRKRFLW 2 WTP (MISCELLANEOUS) ×2 IMPLANT
KIT BASIN OR (CUSTOM PROCEDURE TRAY) ×3 IMPLANT
KIT TURNOVER KIT A (KITS) IMPLANT
MARKER SKIN DUAL TIP RULER LAB (MISCELLANEOUS) IMPLANT
PENCIL SMOKE EVACUATOR (MISCELLANEOUS) IMPLANT
RELOAD EGIA 45 MED/THCK PURPLE (STAPLE) IMPLANT
RELOAD EGIA 45 TAN VASC (STAPLE) IMPLANT
RELOAD EGIA 60 MED/THCK PURPLE (STAPLE) ×4 IMPLANT
RELOAD EGIA 60 TAN VASC (STAPLE) ×1 IMPLANT
RELOAD STAPLE 60 MED/THCK ART (STAPLE) IMPLANT
SCISSORS LAP 5X35 DISP (ENDOMECHANICALS) ×1 IMPLANT
SLEEVE Z-THREAD 5X100MM (TROCAR) IMPLANT
SPIKE FLUID TRANSFER (MISCELLANEOUS) ×3 IMPLANT
SUT VIC AB 2-0 SH 27 (SUTURE)
SUT VIC AB 2-0 SH 27X BRD (SUTURE) IMPLANT
SUT VIC AB 4-0 PS2 27 (SUTURE) ×3 IMPLANT
SUT VICRYL 0 UR6 27IN ABS (SUTURE) IMPLANT
SYS BAG RETRIEVAL 10MM (BASKET) ×1
SYSTEM BAG RETRIEVAL 10MM (BASKET) IMPLANT
TOWEL OR 17X26 10 PK STRL BLUE (TOWEL DISPOSABLE) ×3 IMPLANT
TRAY FOLEY MTR SLVR 16FR STAT (SET/KITS/TRAYS/PACK) ×2 IMPLANT
TRAY LAPAROSCOPIC (CUSTOM PROCEDURE TRAY) ×3 IMPLANT
TROCAR BALLN 12MMX100 BLUNT (TROCAR) ×3 IMPLANT
TROCAR Z-THREAD OPTICAL 5X100M (TROCAR) IMPLANT

## 2022-03-07 NOTE — Anesthesia Procedure Notes (Signed)
Procedure Name: Intubation Date/Time: 03/07/2022 11:00 AM Performed by: Lavina Hamman, CRNA Pre-anesthesia Checklist: Patient identified, Emergency Drugs available, Suction available and Patient being monitored Patient Re-evaluated:Patient Re-evaluated prior to induction Oxygen Delivery Method: Circle System Utilized Preoxygenation: Pre-oxygenation with 100% oxygen Induction Type: IV induction Ventilation: Mask ventilation without difficulty Laryngoscope Size: Miller and 2 Grade View: Grade I Tube type: Oral Tube size: 7.0 mm Number of attempts: 1 Airway Equipment and Method: Stylet and Oral airway Placement Confirmation: ETT inserted through vocal cords under direct vision, positive ETCO2 and breath sounds checked- equal and bilateral Secured at: 22 cm Tube secured with: Tape Dental Injury: Teeth and Oropharynx as per pre-operative assessment  Comments: DL x1 and intubation by Liane Comber, SRNA. Atraumatic, +etCO2, +bilateral breath sounds

## 2022-03-07 NOTE — Transfer of Care (Signed)
Immediate Anesthesia Transfer of Care Note  Patient: [name]  Procedure(s) Performed: APPENDECTOMY LAPAROSCOPIC (Abdomen)  Patient Location: PACU  Anesthesia Type:General  Level of Consciousness: drowsy, patient cooperative and responds to stimulation  Airway & Oxygen Therapy: Patient Spontanous Breathing and Patient connected to face mask  Post-op Assessment: Report given to RN and Post -op Vital signs reviewed and stable  Post vital signs: Reviewed and stable  Last Vitals:  Vitals Value Taken Time  BP 161/83 03/07/22 1207  Temp    Pulse 81 03/07/22 1209  Resp 19 03/07/22 1209  SpO2 100 % 03/07/22 1209  Vitals shown include unvalidated device data.  Last Pain:  Vitals:   03/07/22 0956  TempSrc:   PainSc: 0-No pain      Patients Stated Pain Goal: 4 (24/46/28 6381)  Complications: No notable events documented.

## 2022-03-07 NOTE — Anesthesia Preprocedure Evaluation (Signed)
Anesthesia Evaluation  Patient identified by MRN, date of birth, ID band Patient awake    Reviewed: Allergy & Precautions, NPO status , Patient's Chart, lab work & pertinent test results  History of Anesthesia Complications Negative for: history of anesthetic complications  Airway Mallampati: II  TM Distance: >3 FB Neck ROM: Full    Dental  (+) Teeth Intact, Dental Advisory Given   Pulmonary neg pulmonary ROS,    Pulmonary exam normal        Cardiovascular hypertension, Pt. on medications Normal cardiovascular exam     Neuro/Psych negative neurological ROS     GI/Hepatic Neg liver ROS, Cecal polyp   Endo/Other  diabetes (Pre-DM)  Renal/GU negative Renal ROS  negative genitourinary   Musculoskeletal negative musculoskeletal ROS (+)   Abdominal   Peds  Hematology negative hematology ROS (+)   Anesthesia Other Findings   Reproductive/Obstetrics                           Anesthesia Physical Anesthesia Plan  ASA: 2  Anesthesia Plan: General   Post-op Pain Management: Tylenol PO (pre-op)*, Gabapentin PO (pre-op)* and Toradol IV (intra-op)*   Induction: Intravenous  PONV Risk Score and Plan: 3 and Ondansetron, Dexamethasone, Treatment may vary due to age or medical condition and Midazolam  Airway Management Planned: Oral ETT  Additional Equipment: None  Intra-op Plan:   Post-operative Plan: Extubation in OR  Informed Consent: I have reviewed the patients History and Physical, chart, labs and discussed the procedure including the risks, benefits and alternatives for the proposed anesthesia with the patient or authorized representative who has indicated his/her understanding and acceptance.     Dental advisory given  Plan Discussed with:   Anesthesia Plan Comments:        Anesthesia Quick Evaluation

## 2022-03-07 NOTE — Interval H&P Note (Signed)
History and Physical Interval Note:  03/07/2022 9:52 AM  [name]  has presented today for surgery, with the diagnosis of CECAL POLYP.  The various methods of treatment have been discussed with the patient and family. After consideration of risks, benefits and other options for treatment, the patient has consented to  Procedure(s): APPENDECTOMY LAPAROSCOPIC (N/A) as a surgical intervention.  The patient's history has been reviewed, patient examined, no change in status, stable for surgery.  I have reviewed the patient's chart and labs.  Questions were answered to the patient's satisfaction.     Rosario Adie, MD  Colorectal and [place] [facility]

## 2022-03-07 NOTE — Op Note (Signed)
GENEA RHEAUME [id number]   PRE-OPERATIVE DIAGNOSIS:  CECAL POLYP  POST-OPERATIVE DIAGNOSIS:  CECAL POLYP   Procedure(s): APPENDECTOMY LAPAROSCOPIC   Surgeon(s): Leighton Ruff, MD  ASSISTANT: none   ANESTHESIA:   local and general  EBL:  59m  Delay start of Pharmacological VTE agent (>24hrs) due to surgical blood loss or risk of bleeding:  no  DRAINS: none   SPECIMEN:  Source of Specimen:  appendix  DISPOSITION OF SPECIMEN:  PATHOLOGY  COUNTS:  YES  PLAN OF CARE: Admit for overnight observation  PATIENT DISPOSITION:  PACU - hemodynamically stable.   INDICATIONS: Patient with concerning symptoms & work up suspicious for appendicitis.  Surgery was recommended:  The anatomy & physiology of the digestive tract was discussed.  The pathophysiology of appendicitis was discussed.  Natural history risks without surgery was discussed.   I feel the risks of no intervention will lead to serious problems that outweigh the operative risks; therefore, I recommended diagnostic laparoscopy with removal of appendix to remove the pathology.  Laparoscopic & open techniques were discussed.   I noted a good likelihood this will help address the problem.    Risks such as bleeding, infection, abscess, leak, reoperation, possible ostomy, hernia, heart attack, death, and other risks were discussed.  Goals of post-operative recovery were discussed as well.  We will work to minimize complications.  Questions were answered.  The patient expresses understanding & wishes to proceed with surgery.  OR FINDINGS: normal appearing appendix.  Polyp noted inside appendix  DESCRIPTION:   The patient was identified & brought into the operating room. The patient was positioned supine with left arm tucked. SCDs were active during the entire case. The patient underwent general anesthesia without any difficulty.  A foley catheter was inserted under sterile conditions. The abdomen was prepped and draped in a  sterile fashion. A Surgical Timeout confirmed our plan.   I made a transverse incision through the inferior umbilical fold.  I made a nick in the infraumbilical fascia and confirmed peritoneal entry.  I placed a stay suture and then [facility].  We induced carbon dioxide insufflation.  Camera inspection revealed no injury.  I placed additional ports under direct laparoscopic visualization.  I mobilized the terminal ileum to proximal ascending colon in a lateral to medial fashion.  I took care to avoid injuring any retroperitoneal structures.   I freed the appendix off its attachments to the ascending colon and cecal mesentery.  I elevated the appendix.  I was able to free off the base of the appendix, which was still viable.  I stapled the appendix off the cecum using a laparoscopic bowel load stapler x2.  I took a healthy cuff viable cecum just to the level of the IC valve. I ligated the mesoappendix with a vascular load stapler.   I placed the appendix inside an EndoCatch bag and removed out the [facility].  I did copious irrigation. Hemostasis was good in the mesoappendix, colon mesentery, and retroperitoneum. Staple line was intact on the cecum with no bleeding. I washed out the pelvis, retrohepatic space and right paracolic gutter.  Hemostasis is good. There was no perforation or injury.   I aspirated the carbon dioxide. I removed the ports. I closed the umbilical fascia site using a 0 Vicryl stitch. I closed skin using 4-0 vicryl stitch.  Sterile dressings were applied.  Patient was extubated and sent to the recovery room.  I discussed the operative findings with the patient's family. I  suspect the patient is going used in the hospital at least overnight and will need antibiotics for 0 days. Questions answered. They expressed understanding and appreciation.   Rosario Adie, MD  [facility]

## 2022-03-07 NOTE — Discharge Instructions (Signed)
LAPAROSCOPIC SURGERY: POST OP INSTRUCTIONS  DIET: Follow a light bland diet the first 24 hours after arrival home, such as soup, liquids, crackers, etc.  Be sure to include lots of fluids daily.  Avoid fast food or heavy meals as your are more likely to get nauseated.  Eat a low fat the next few days after surgery.   Take your usually prescribed home medications unless otherwise directed. PAIN CONTROL: Pain is best controlled by a usual combination of three different methods TOGETHER: Ice/Heat Over the counter pain medication Prescription pain medication Most patients will experience some swelling and bruising around the incisions.  Ice packs or heating pads (30-60 minutes up to 6 times a day) will help. Use ice for the first few days to help decrease swelling and bruising, then switch to heat to help relax tight/sore spots and speed recovery.  Some people prefer to use ice alone, heat alone, alternating between ice & heat.  Experiment to what works for you.  Swelling and bruising can take several weeks to resolve.   It is helpful to take an over-the-counter pain medication regularly for the first few weeks.  Choose one of the following that works best for you: Naproxen (Aleve, etc)  Two 220mg tabs twice a day Ibuprofen (Advil, etc) Three 200mg tabs four times a day (every meal & bedtime) A  prescription for pain medication (such as percocet, vicodin, oxycodone, hydrocodone, etc) should be given to you upon discharge.  Take your pain medication as prescribed.  If you are having problems/concerns with the prescription medicine (does not control pain, nausea, vomiting, rash, itching, etc), please call us [phone] to see if we need to switch you to a different pain medicine that will work better for you and/or control your side effect better. If you need a refill on your pain medication, please contact your pharmacy.  They will contact our office to request authorization. Prescriptions will not be  filled after 5 pm or on week-ends.   Avoid getting constipated.  Between the surgery and the pain medications, it is common to experience some constipation.  Increasing fluid intake and taking a fiber supplement (such as Metamucil, Citrucel, FiberCon, MiraLax, etc) 1-2 times a day regularly will usually help prevent this problem from occurring.  A mild laxative (prune juice, Milk of Magnesia, MiraLax, etc) should be taken according to package directions if there are no bowel movements after 48 hours.   Watch out for diarrhea.  If you have many loose bowel movements, simplify your diet to bland foods & liquids for a few days.  Stop any stool softeners and decrease your fiber supplement.  Switching to mild anti-diarrheal medications (Kayopectate, Pepto Bismol) can help.  If this worsens or does not improve, please call us. Wash / shower every day.  You may shower over the dressings as they are waterproof.  Continue to shower over incision(s) after the dressing is off. Remove your waterproof bandages 5 days after surgery.  You may leave the incision open to air.  You may replace a dressing/Band-Aid to cover the incision for comfort if you wish.  ACTIVITIES as tolerated:   You may resume regular (light) daily activities beginning the next day--such as daily self-care, walking, climbing stairs--gradually increasing activities as tolerated.  If you can walk 30 minutes without difficulty, it is safe to try more intense activity such as jogging, treadmill, bicycling, low-impact aerobics, swimming, etc. Save the most intensive and strenuous activity for last such as sit-ups, heavy   lifting, contact sports, etc  Refrain from any heavy lifting or straining until you are off narcotics for pain control.   DO NOT PUSH THROUGH PAIN.  Let pain be your guide: If it hurts to do something, don't do it.  Pain is your body warning you to avoid that activity for another week until the pain goes down. You may drive when you are  no longer taking prescription pain medication, you can comfortably wear a seatbelt, and you can safely maneuver your car and apply brakes. You may have sexual intercourse when it is comfortable.  FOLLOW UP in our office Please call [facility] at [phone] to set up an appointment to see your surgeon in the office for a follow-up appointment approximately 2-3 weeks after your surgery. Make sure that you call for this appointment the day you arrive home to insure a convenient appointment time. 10. IF YOU HAVE DISABILITY OR FAMILY LEAVE FORMS, BRING THEM TO THE OFFICE FOR PROCESSING.  DO NOT GIVE THEM TO YOUR DOCTOR.   WHEN TO CALL US [phone]: Poor pain control Reactions / problems with new medications (rash/itching, nausea, etc)  Fever over 101.5 F (38.5 C) Inability to urinate Nausea and/or vomiting Worsening swelling or bruising Continued bleeding from incision. Increased pain, redness, or drainage from the incision   The clinic staff is available to answer your questions during regular business hours (8:30am-5pm).  Please don't hesitate to call and ask to speak to one of our nurses for clinical concerns.   If you have a medical emergency, go to the nearest emergency room or call 911.  A surgeon from [facility] is always on call at the hospitals   [facility] [address], [address], [place], [place]  [address] ? MAIN: [phone] ? TOLL FREE: [phone] ?  FAX [phone] [email or web address]   

## 2022-03-08 ENCOUNTER — Encounter (HOSPITAL_COMMUNITY): Payer: Self-pay | Admitting: General Surgery

## 2022-03-08 DIAGNOSIS — E119 Type 2 diabetes mellitus without complications: Secondary | ICD-10-CM | POA: Diagnosis not present

## 2022-03-08 DIAGNOSIS — D120 Benign neoplasm of cecum: Secondary | ICD-10-CM | POA: Diagnosis not present

## 2022-03-08 DIAGNOSIS — I10 Essential (primary) hypertension: Secondary | ICD-10-CM | POA: Diagnosis not present

## 2022-03-08 MED ORDER — TRAMADOL HCL 50 MG PO TABS: 50.0000 mg | ORAL_TABLET | Freq: Four times a day (QID) | ORAL | 0 refills | Status: DC | PRN

## 2022-03-08 NOTE — Anesthesia Postprocedure Evaluation (Signed)
Anesthesia Post Note  Patient: [name]  Procedure(s) Performed: APPENDECTOMY LAPAROSCOPIC (Abdomen)     Patient location during evaluation: PACU Anesthesia Type: General Level of consciousness: awake and alert Pain management: pain level controlled Vital Signs Assessment: post-procedure vital signs reviewed and stable Respiratory status: spontaneous breathing, nonlabored ventilation and respiratory function stable Cardiovascular status: blood pressure returned to baseline and stable Postop Assessment: no apparent nausea or vomiting Anesthetic complications: no   No notable events documented.  Last Vitals:  Vitals:   03/08/22 0231 03/08/22 0516  BP: 115/63 127/64  Pulse: 68 60  Resp: 18 18  Temp: 36.6 C 37.3 C  SpO2: 98% 99%    Last Pain:  Vitals:   03/08/22 0801  TempSrc:   PainSc: 0-No pain                 Lidia Collum

## 2022-03-08 NOTE — Progress Notes (Signed)
Discharge instructions given to patient and all questions were answered.  

## 2022-03-08 NOTE — Discharge Summary (Signed)
Patient ID: [name] [id number] 66 y.o. Dec 27, 1955  03/07/2022  Discharge date and time: 03/08/22   Admitting Physician: Rosario Adie  Discharge Physician: Rosario Adie  Admission Diagnoses: Cecal polyp [K63.5]  Discharge Diagnoses: same  Operations: Procedure(s): APPENDECTOMY LAPAROSCOPIC    Discharged Condition: good    Hospital Course: Patient did well overnight.  Discharged to home tolerating a diet and PO pain meds  Consults: None  Significant Diagnostic Studies: none  Treatments: surgery: lap appy  Disposition: [place], MD  [facility]

## 2022-03-09 LAB — SURGICAL PATHOLOGY

## 2022-03-15 ENCOUNTER — Other Ambulatory Visit: Payer: Self-pay | Admitting: Registered Nurse

## 2022-03-15 DIAGNOSIS — I10 Essential (primary) hypertension: Secondary | ICD-10-CM

## 2022-03-15 NOTE — Telephone Encounter (Signed)
Will defer back to you  Thanks,  Denice Paradise

## 2022-03-15 NOTE — Telephone Encounter (Signed)
Refilled

## 2022-03-16 ENCOUNTER — Other Ambulatory Visit: Payer: Self-pay | Admitting: Family Medicine

## 2022-03-16 DIAGNOSIS — Z780 Asymptomatic menopausal state: Secondary | ICD-10-CM

## 2022-04-04 ENCOUNTER — Ambulatory Visit: Payer: Medicare PPO | Admitting: Family Medicine

## 2022-05-21 ENCOUNTER — Encounter: Payer: Self-pay | Admitting: *Deleted

## 2022-05-21 ENCOUNTER — Other Ambulatory Visit: Payer: Self-pay | Admitting: *Deleted

## 2022-05-21 NOTE — Patient Outreach (Signed)
  Care Coordination   Initial Visit Note   05/21/2022 Name: [name] MRN: [id number] DOB: 1955/11/08  [name] is a 66 y.o. year old female who sees Wendie Agreste, MD for primary care. I spoke with  [name] by phone today  What matters to the patients health and wellness today?  No needs    Goals Addressed               This Visit's Progress     No needs (pt-stated)        Care Coordination Interventions: Reviewed medications with patient and discussed purpose of all medications Reviewed scheduled/upcoming provider appointments including all pending appointments. Pt states she has a new provided not sure name but with "[facility] [employer]" and her PCP [facility] is 06/05/2022 Screening for signs and symptoms of depression related to chronic disease state  Assessed social determinant of health barriers         SDOH assessments and interventions completed:  Yes  SDOH Interventions Today    Flowsheet Row Most Recent Value  SDOH Interventions   Food Insecurity Interventions Intervention Not Indicated  Housing Interventions Intervention Not Indicated  Transportation Interventions Intervention Not Indicated        Care Coordination Interventions Activated:  Yes  Care Coordination Interventions:  Yes, provided   Follow up plan: No further intervention required.   Encounter Outcome:  Pt. Visit Completed   Tanya Mina, RN Care Management Coordinator [facility] Office [phone]

## 2022-06-22 ENCOUNTER — Ambulatory Visit
Admission: RE | Admit: 2022-06-22 | Discharge: 2022-06-22 | Disposition: A | Discharge status: Home / Self Care | Payer: Medicare HMO | Source: Ambulatory Visit | Attending: Family Medicine | Admitting: Family Medicine

## 2022-06-22 ENCOUNTER — Other Ambulatory Visit: Payer: Self-pay | Admitting: Family Medicine

## 2022-06-22 DIAGNOSIS — Z01818 Encounter for other preprocedural examination: Secondary | ICD-10-CM

## 2022-09-05 ENCOUNTER — Ambulatory Visit
Admission: RE | Admit: 2022-09-05 | Discharge: 2022-09-05 | Disposition: A | Discharge status: Home / Self Care | Payer: Medicare HMO | Source: Ambulatory Visit | Attending: Family Medicine | Admitting: Family Medicine

## 2022-09-05 DIAGNOSIS — Z780 Asymptomatic menopausal state: Secondary | ICD-10-CM

## 2023-01-22 DIAGNOSIS — H524 Presbyopia: Secondary | ICD-10-CM | POA: Diagnosis not present

## 2023-02-12 DIAGNOSIS — I10 Essential (primary) hypertension: Secondary | ICD-10-CM | POA: Diagnosis not present

## 2023-02-12 DIAGNOSIS — R7303 Prediabetes: Secondary | ICD-10-CM | POA: Diagnosis not present

## 2023-02-12 DIAGNOSIS — Z23 Encounter for immunization: Secondary | ICD-10-CM | POA: Diagnosis not present

## 2023-02-12 DIAGNOSIS — R053 Chronic cough: Secondary | ICD-10-CM | POA: Diagnosis not present

## 2023-02-13 ENCOUNTER — Other Ambulatory Visit: Payer: Self-pay | Admitting: Family Medicine

## 2023-02-13 DIAGNOSIS — R928 Other abnormal and inconclusive findings on diagnostic imaging of breast: Secondary | ICD-10-CM

## 2023-02-13 DIAGNOSIS — Z1231 Encounter for screening mammogram for malignant neoplasm of breast: Secondary | ICD-10-CM

## 2023-03-08 ENCOUNTER — Ambulatory Visit
Admission: RE | Admit: 2023-03-08 | Discharge: 2023-03-08 | Disposition: A | Discharge status: Home / Self Care | Payer: Medicare HMO | Source: Ambulatory Visit | Attending: Family Medicine | Admitting: Family Medicine

## 2023-03-08 ENCOUNTER — Ambulatory Visit: Payer: Medicare HMO

## 2023-03-08 DIAGNOSIS — Z1231 Encounter for screening mammogram for malignant neoplasm of breast: Secondary | ICD-10-CM

## 2023-04-16 ENCOUNTER — Other Ambulatory Visit: Payer: Self-pay | Admitting: Family Medicine

## 2023-04-16 DIAGNOSIS — I10 Essential (primary) hypertension: Secondary | ICD-10-CM

## 2023-09-27 DIAGNOSIS — R053 Chronic cough: Secondary | ICD-10-CM | POA: Diagnosis not present

## 2023-09-27 DIAGNOSIS — I10 Essential (primary) hypertension: Secondary | ICD-10-CM | POA: Diagnosis not present

## 2023-09-27 DIAGNOSIS — R7303 Prediabetes: Secondary | ICD-10-CM | POA: Diagnosis not present

## 2023-09-27 DIAGNOSIS — M8580 Other specified disorders of bone density and structure, unspecified site: Secondary | ICD-10-CM | POA: Diagnosis not present

## 2023-09-27 DIAGNOSIS — Z0000 Encounter for general adult medical examination without abnormal findings: Secondary | ICD-10-CM | POA: Diagnosis not present

## 2023-09-27 DIAGNOSIS — D72819 Decreased white blood cell count, unspecified: Secondary | ICD-10-CM | POA: Diagnosis not present

## 2023-09-27 DIAGNOSIS — D696 Thrombocytopenia, unspecified: Secondary | ICD-10-CM | POA: Diagnosis not present

## 2023-09-27 DIAGNOSIS — Z13220 Encounter for screening for lipoid disorders: Secondary | ICD-10-CM | POA: Diagnosis not present

## 2023-09-27 DIAGNOSIS — Z1159 Encounter for screening for other viral diseases: Secondary | ICD-10-CM | POA: Diagnosis not present

## 2023-10-04 ENCOUNTER — Ambulatory Visit
Admission: RE | Admit: 2023-10-04 | Discharge: 2023-10-04 | Disposition: A | Discharge status: Home / Self Care | Payer: Medicare HMO | Source: Ambulatory Visit | Attending: Family Medicine | Admitting: Family Medicine

## 2023-10-04 ENCOUNTER — Other Ambulatory Visit: Payer: Self-pay | Admitting: Family Medicine

## 2023-10-04 DIAGNOSIS — R053 Chronic cough: Secondary | ICD-10-CM

## 2023-12-30 DIAGNOSIS — R051 Acute cough: Secondary | ICD-10-CM | POA: Diagnosis not present

## 2023-12-30 DIAGNOSIS — U071 COVID-19: Secondary | ICD-10-CM | POA: Diagnosis not present

## 2024-01-01 ENCOUNTER — Institutional Professional Consult (permissible substitution): Payer: Medicare HMO | Admitting: Pulmonary Disease

## 2024-02-05 ENCOUNTER — Other Ambulatory Visit: Payer: Self-pay | Admitting: Family Medicine

## 2024-02-05 DIAGNOSIS — Z1231 Encounter for screening mammogram for malignant neoplasm of breast: Secondary | ICD-10-CM

## 2024-02-27 DIAGNOSIS — H35033 Hypertensive retinopathy, bilateral: Secondary | ICD-10-CM | POA: Diagnosis not present

## 2024-02-28 ENCOUNTER — Ambulatory Visit: Admitting: Pulmonary Disease

## 2024-02-28 ENCOUNTER — Encounter: Payer: Self-pay | Admitting: Pulmonary Disease

## 2024-02-28 VITALS — BP 145/84 | HR 73 | Ht 60.0 in | Wt 140.0 lb

## 2024-02-28 DIAGNOSIS — R062 Wheezing: Secondary | ICD-10-CM

## 2024-02-28 DIAGNOSIS — R053 Chronic cough: Secondary | ICD-10-CM

## 2024-02-28 MED ORDER — BUDESONIDE-FORMOTEROL FUMARATE 80-4.5 MCG/ACT IN AERO: 2.0000 | INHALATION_SPRAY | Freq: Two times a day (BID) | RESPIRATORY_TRACT | 5 refills | Status: DC

## 2024-02-28 MED ORDER — ALBUTEROL SULFATE HFA 108 (90 BASE) MCG/ACT IN AERS: 2.0000 | INHALATION_SPRAY | Freq: Four times a day (QID) | RESPIRATORY_TRACT | 6 refills | Status: DC | PRN

## 2024-02-28 NOTE — Patient Instructions (Signed)
 Start symbicort inhaler 2 puffs twice daily - rinse mouth out after each use - consider leaving by your tooth brush  Use albuterol inhaler 1-2 puffs every 4-6 hours as needed for cough, chest tightness or wheezing - carry this inhaler with you when you leave the house to use as needed  Schedule pulmonary function tests at the front desk  Follow up in 2 months

## 2024-02-28 NOTE — Progress Notes (Signed)
 Synopsis: Referred in May 2025 for Chronic Cough  Subjective:   PATIENT ID: [name] GENDER: female DOB: November 04, 1955, MRN: [id number]  HPI  Chief Complaint  Patient presents with   Consult    Pt states cough off & on x 3 years, gasping never smoked nor had asthma as child    [name] is a 68 year old woman, never smoker with hypertension who is referred to pulmonary clinic for chronic cough.   She experiences a persistent, non-productive cough with occasional wheezing for three years. The cough is not seasonal and is sometimes more active after eating or at night when lying down. She experiences mild chest discomfort after eating, without heartburn, and occasional chest tightness. There is no shortness of breath.  She has no history of smoking but was exposed to secondhand smoke from her mother and former husband. 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 She has adjusted her eating habits to avoid late-night meals.  Her family history is negative for lung conditions. She is on medication for high blood pressure and takes vitamins. She had chest x-rays in December 2024 and 2023.   Past Medical History:  Diagnosis Date   Allergy    mild   Arthritis    L knee   Hyperlipidemia    Hypertension    on meds   Pre-diabetes    Prediabetes    no meds- diet controlled   Seizures (HCC)    seizures with pregnancy 33 yrs ago - none since   Seizures (HCC)    hx of 30 years ago   TBI (traumatic brain injury) (HCC)    Memory issues improved   Vitamin D  deficiency      Family History  Problem Relation Age of Onset   Hypertension Mother    Diabetes Mother    Heart disease Father    Cancer Brother    Kidney disease Other    Colon cancer Neg Hx    Colon polyps Neg Hx    Esophageal cancer Neg Hx    Rectal cancer Neg Hx    Stomach cancer Neg Hx      Social History   Socioeconomic History   Marital status: Divorced    Spouse  name: Not on file   Number of children: Not on file   Years of education: Not on file   Highest education level: Not on file  Occupational History   Not on file  Tobacco Use   Smoking status: Never   Smokeless tobacco: Never  Vaping Use   Vaping status: Never Used  Substance and Sexual Activity   Alcohol use: No   Drug use: No   Sexual activity: Not on file  Other Topics Concern   Not on file  Social History Narrative   Not on file   Social Drivers of Health   Financial Resource Strain: Not on file  Food Insecurity: No Food Insecurity (05/21/2022)   Hunger Vital Sign    Worried About Running Out of Food in the Last Year: Never true    Ran Out of Food in the Last Year: Never true  Transportation Needs: No Transportation Needs (05/21/2022)   PRAPARE - Administrator, Civil Service (Medical): No    Lack of Transportation (Non-Medical): No  Physical Activity: Not on file  Stress: Not on file  Social Connections: Not on file  Intimate Partner Violence: Not At Risk (05/21/2022)   Humiliation, Afraid, Rape,  and Kick questionnaire    Fear of Current or Ex-Partner: No    Emotionally Abused: No    Physically Abused: No    Sexually Abused: No     No Known Allergies   Outpatient Medications Prior to Visit  Medication Sig Dispense Refill   ACCU-CHEK AVIVA PLUS test strip Check readings once per week. 100 each 0   Accu-Chek Softclix Lancets lancets Check reading once per week, 100 each 0   Alcohol Swabs (B-D SINGLE USE SWABS REGULAR) PADS      Blood Glucose Monitoring Suppl (ACCU-CHEK AVIVA PLUS) w/Device KIT      Cholecalciferol (VITAMIN D3) 50 MCG (2000 UT) TABS Take 2,000 Units by mouth in the morning.     irbesartan -hydrochlorothiazide  (AVALIDE) 300-12.5 MG tablet TAKE 1 TABLET BY MOUTH DAILY 90 tablet 0   traMADol  (ULTRAM ) 50 MG tablet Take 1 tablet (50 mg total) by mouth every 6 (six) hours as needed (mild pain). 10 tablet 0   No facility-administered medications  prior to visit.    Review of Systems  Constitutional:  Negative for chills, fever, malaise/fatigue and weight loss.  HENT:  Negative for congestion, sinus pain and sore throat.   Eyes: Negative.   Respiratory:  Positive for cough and wheezing. Negative for hemoptysis, sputum production and shortness of breath.   Cardiovascular:  Negative for chest pain, palpitations, orthopnea, claudication and leg swelling.  Gastrointestinal:  Negative for abdominal pain, heartburn, nausea and vomiting.  Genitourinary: Negative.   Musculoskeletal:  Negative for joint pain and myalgias.  Skin:  Negative for rash.  Neurological:  Negative for weakness.  Endo/Heme/Allergies: Negative.   Psychiatric/Behavioral: Negative.     Objective:   Vitals:   02/28/24 0858  BP: (!) 145/84  Pulse: 73  SpO2: 97%  Weight: 140 lb (63.5 kg)  Height: 5' (1.524 m)   Physical Exam Constitutional:      General: She is not in acute distress.    Appearance: Normal appearance.  Eyes:     General: No scleral icterus.    Conjunctiva/sclera: Conjunctivae normal.  Cardiovascular:     Rate and Rhythm: Normal rate and regular rhythm.  Pulmonary:     Breath sounds: No wheezing, rhonchi or rales.  Musculoskeletal:     Right lower leg: No edema.     Left lower leg: No edema.  Skin:    General: Skin is warm and dry.  Neurological:     General: No focal deficit present.    CBC    Component Value Date/Time   WBC 3.5 (L) 03/02/2022 1030   RBC 4.31 03/02/2022 1030   HGB 13.3 03/02/2022 1030   HGB 13.2 02/04/2020 1620   HCT 41.4 03/02/2022 1030   HCT 38.5 02/04/2020 1620   PLT 170 03/02/2022 1030   PLT 183 02/04/2020 1620   MCV 96.1 03/02/2022 1030   MCV 92 02/04/2020 1620   MCH 30.9 03/02/2022 1030   MCHC 32.1 03/02/2022 1030   RDW 11.9 03/02/2022 1030   RDW 11.6 (L) 02/04/2020 1620   LYMPHSABS 1,435 04/30/2016 1200   MONOABS 420 04/30/2016 1200   EOSABS 35 04/30/2016 1200   BASOSABS 35 04/30/2016 1200       Latest Ref Rng & Units 03/02/2022   10:30 AM 10/11/2021    3:20 PM 03/01/2021   11:10 AM  BMP  Glucose 70 - 99 mg/dL 96  83  92   BUN 8 - 23 mg/dL 21  18  21    Creatinine 0.44 -  1.00 mg/dL 1.61  0.96  0.45   Sodium 135 - 145 mmol/L 141  138  137   Potassium 3.5 - 5.1 mmol/L 3.7  3.7  3.7   Chloride 98 - 111 mmol/L 105  100  100   CO2 22 - 32 mmol/L 29  31  29    Calcium 8.9 - 10.3 mg/dL 9.4  9.7  9.7    Chest imaging: CXR  10/04/23 Cardiomediastinal silhouette and pulmonary vasculature are within normal limits.  PFT:     No data to display          Labs:  Path:  Echo:  Heart Catheterization:    Assessment & Plan:   Chronic cough - Plan: Pulmonary Function Test, budesonide-formoterol (SYMBICORT) 80-4.5 MCG/ACT inhaler, albuterol (VENTOLIN HFA) 108 (90 Base) MCG/ACT inhaler  Wheezing - Plan: Pulmonary Function Test, budesonide-formoterol (SYMBICORT) 80-4.5 MCG/ACT inhaler  Discussion: Paizlee Bellinghausen is a 68 year old woman, never smoker with hypertension who is referred to pulmonary clinic for chronic cough.   Chronic cough Chronic cough for three years with wheezing and chest tightness. Differential includes reactive airway disease and potential reflux. Normal chest x-ray. Discussed reflux contribution despite lack of heartburn. - Order pulmonary function tests. - Prescribe Symbicort inhaler: two puffs BID, rinse mouth after use. - Prescribe albuterol inhaler PRN: one to two puffs every 4-6 hours for cough or wheeze. - Educated on inhaler use and mouth rinsing post-Symbicort. - Discuss potential reflux medication if no improvement with inhalers. - Advise lifestyle modifications to avoid reflux triggers.  Follow up in 2 months  Duaine German, MD [facility] Office: [phone]    Current Outpatient Medications:    ACCU-CHEK AVIVA PLUS test strip, Check readings once per week., Disp: 100 each, Rfl: 0   Accu-Chek Softclix Lancets  lancets, Check reading once per week,, Disp: 100 each, Rfl: 0   albuterol (VENTOLIN HFA) 108 (90 Base) MCG/ACT inhaler, Inhale 2 puffs into the lungs every 6 (six) hours as needed for wheezing or shortness of breath., Disp: 8 g, Rfl: 6   Alcohol Swabs (B-D SINGLE USE SWABS REGULAR) PADS, , Disp: , Rfl:    Blood Glucose Monitoring Suppl (ACCU-CHEK AVIVA PLUS) w/Device KIT, , Disp: , Rfl:    budesonide-formoterol (SYMBICORT) 80-4.5 MCG/ACT inhaler, Inhale 2 puffs into the lungs 2 (two) times daily., Disp: 1 each, Rfl: 5   Cholecalciferol (VITAMIN D3) 50 MCG (2000 UT) TABS, Take 2,000 Units by mouth in the morning., Disp: , Rfl:    irbesartan -hydrochlorothiazide  (AVALIDE) 300-12.5 MG tablet, TAKE 1 TABLET BY MOUTH DAILY, Disp: 90 tablet, Rfl: 0   traMADol  (ULTRAM ) 50 MG tablet, Take 1 tablet (50 mg total) by mouth every 6 (six) hours as needed (mild pain)., Disp: 10 tablet, Rfl: 0

## 2024-03-10 ENCOUNTER — Ambulatory Visit
Admission: RE | Admit: 2024-03-10 | Discharge: 2024-03-10 | Disposition: A | Discharge status: Home / Self Care | Source: Ambulatory Visit | Attending: Family Medicine

## 2024-03-10 DIAGNOSIS — Z1231 Encounter for screening mammogram for malignant neoplasm of breast: Secondary | ICD-10-CM

## 2024-03-24 DIAGNOSIS — R7303 Prediabetes: Secondary | ICD-10-CM | POA: Diagnosis not present

## 2024-03-24 DIAGNOSIS — Z780 Asymptomatic menopausal state: Secondary | ICD-10-CM | POA: Diagnosis not present

## 2024-03-24 DIAGNOSIS — I10 Essential (primary) hypertension: Secondary | ICD-10-CM | POA: Diagnosis not present

## 2024-03-24 DIAGNOSIS — M8580 Other specified disorders of bone density and structure, unspecified site: Secondary | ICD-10-CM | POA: Diagnosis not present

## 2024-03-24 DIAGNOSIS — R053 Chronic cough: Secondary | ICD-10-CM | POA: Diagnosis not present

## 2024-04-28 ENCOUNTER — Ambulatory Visit: Admitting: Pulmonary Disease

## 2024-04-28 DIAGNOSIS — R053 Chronic cough: Secondary | ICD-10-CM | POA: Diagnosis not present

## 2024-04-28 DIAGNOSIS — R062 Wheezing: Secondary | ICD-10-CM

## 2024-04-28 LAB — PULMONARY FUNCTION TEST
DL/VA % pred: 108 %
DL/VA: 4.66 ml/min/mmHg/L
DLCO unc % pred: 95 %
DLCO unc: 16.41 ml/min/mmHg
FEF 25-75 Post: 1.78 L/s
FEF 25-75 Pre: 1.74 L/s
FEF2575-%Change-Post: 2 %
FEF2575-%Pred-Post: 98 %
FEF2575-%Pred-Pre: 95 %
FEV1-%Change-Post: 2 %
FEV1-%Pred-Post: 90 %
FEV1-%Pred-Pre: 88 %
FEV1-Post: 1.8 L
FEV1-Pre: 1.76 L
FEV1FVC-%Change-Post: 1 %
FEV1FVC-%Pred-Pre: 104 %
FEV6-%Change-Post: 0 %
FEV6-%Pred-Post: 87 %
FEV6-%Pred-Pre: 87 %
FEV6-Post: 2.21 L
FEV6-Pre: 2.19 L
FEV6FVC-%Change-Post: 0 %
FEV6FVC-%Pred-Post: 104 %
FEV6FVC-%Pred-Pre: 104 %
FVC-%Change-Post: 0 %
FVC-%Pred-Post: 84 %
FVC-%Pred-Pre: 83 %
FVC-Post: 2.21 L
FVC-Pre: 2.19 L
Post FEV1/FVC ratio: 81 %
Post FEV6/FVC ratio: 100 %
Pre FEV1/FVC ratio: 80 %
Pre FEV6/FVC Ratio: 100 %
RV % pred: 90 %
RV: 1.75 L
TLC % pred: 86 %
TLC: 3.83 L

## 2024-04-28 NOTE — Patient Instructions (Signed)
 Full pft performed today.

## 2024-04-28 NOTE — Progress Notes (Signed)
 Full pft performed today.

## 2024-04-29 ENCOUNTER — Encounter: Payer: Self-pay | Admitting: Pulmonary Disease

## 2024-04-29 ENCOUNTER — Ambulatory Visit (INDEPENDENT_AMBULATORY_CARE_PROVIDER_SITE_OTHER): Admitting: Pulmonary Disease

## 2024-04-29 VITALS — BP 132/84 | HR 66 | Wt 145.0 lb

## 2024-04-29 DIAGNOSIS — R053 Chronic cough: Secondary | ICD-10-CM | POA: Diagnosis not present

## 2024-04-29 DIAGNOSIS — J4520 Mild intermittent asthma, uncomplicated: Secondary | ICD-10-CM

## 2024-04-29 DIAGNOSIS — R062 Wheezing: Secondary | ICD-10-CM

## 2024-04-29 DIAGNOSIS — I498 Other specified cardiac arrhythmias: Secondary | ICD-10-CM | POA: Diagnosis not present

## 2024-04-29 MED ORDER — BUDESONIDE-FORMOTEROL FUMARATE 80-4.5 MCG/ACT IN AERO: 2.0000 | INHALATION_SPRAY | Freq: Two times a day (BID) | RESPIRATORY_TRACT | 11 refills | Status: AC

## 2024-04-29 NOTE — Progress Notes (Signed)
 Synopsis: Referred in May 2025 for Chronic Cough  Subjective:   PATIENT ID: [name] GENDER: female DOB: 25-May-1956, MRN: [id number]  HPI  Chief Complaint  Patient presents with   Follow-up    pft   [name] is a 68 year old woman, never smoker with hypertension who is referred to pulmonary clinic for chronic cough.   Initial OV 02/28/24 She experiences a persistent, non-productive cough with occasional wheezing for three years. The cough is not seasonal and is sometimes more active after eating or at night when lying down. She experiences mild chest discomfort after eating, without heartburn, and occasional chest tightness. There is no shortness of breath.  She has no history of smoking but was exposed to secondhand smoke from her mother and former husband. 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 She has adjusted her eating habits to avoid late-night meals.  Her family history is negative for lung conditions. She is on medication for high blood pressure and takes vitamins. She had chest x-rays in December 2024 and 2023.  OV 04/29/24   Past Medical History:  Diagnosis Date   Allergy    mild   Arthritis    L knee   Hyperlipidemia    Hypertension    on meds   Pre-diabetes    Prediabetes    no meds- diet controlled   Seizures (HCC)    seizures with pregnancy 33 yrs ago - none since   Seizures (HCC)    hx of 30 years ago   TBI (traumatic brain injury) (HCC)    Memory issues improved   Vitamin D  deficiency      Family History  Problem Relation Age of Onset   Hypertension Mother    Diabetes Mother    Heart disease Father    Cancer Brother    Kidney disease Other    Colon cancer Neg Hx    Colon polyps Neg Hx    Esophageal cancer Neg Hx    Rectal cancer Neg Hx    Stomach cancer Neg Hx      Social History   Socioeconomic History   Marital status: Divorced    Spouse name: Not on file   Number of children:  Not on file   Years of education: Not on file   Highest education level: Not on file  Occupational History   Not on file  Tobacco Use   Smoking status: Never   Smokeless tobacco: Never  Vaping Use   Vaping status: Never Used  Substance and Sexual Activity   Alcohol use: No   Drug use: No   Sexual activity: Not on file  Other Topics Concern   Not on file  Social History Narrative   Not on file   Social Drivers of Health   Financial Resource Strain: Not on file  Food Insecurity: No Food Insecurity (05/21/2022)   Hunger Vital Sign    Worried About Running Out of Food in the Last Year: Never true    Ran Out of Food in the Last Year: Never true  Transportation Needs: No Transportation Needs (05/21/2022)   PRAPARE - Administrator, Civil Service (Medical): No    Lack of Transportation (Non-Medical): No  Physical Activity: Not on file  Stress: Not on file  Social Connections: Not on file  Intimate Partner Violence: Not At Risk (05/21/2022)   Humiliation, Afraid, Rape, and Kick questionnaire    Fear of Current or Ex-Partner:  No    Emotionally Abused: No    Physically Abused: No    Sexually Abused: No     No Known Allergies   Outpatient Medications Prior to Visit  Medication Sig Dispense Refill   ACCU-CHEK AVIVA PLUS test strip Check readings once per week. 100 each 0   Accu-Chek Softclix Lancets lancets Check reading once per week, 100 each 0   albuterol  (VENTOLIN  HFA) 108 (90 Base) MCG/ACT inhaler Inhale 2 puffs into the lungs every 6 (six) hours as needed for wheezing or shortness of breath. 8 g 6   Alcohol Swabs (B-D SINGLE USE SWABS REGULAR) PADS      Blood Glucose Monitoring Suppl (ACCU-CHEK AVIVA PLUS) w/Device KIT      Cholecalciferol (VITAMIN D3) 50 MCG (2000 UT) TABS Take 2,000 Units by mouth in the morning.     irbesartan -hydrochlorothiazide  (AVALIDE) 300-12.5 MG tablet TAKE 1 TABLET BY MOUTH DAILY 90 tablet 0   traMADol  (ULTRAM ) 50 MG tablet Take 1 tablet  (50 mg total) by mouth every 6 (six) hours as needed (mild pain). 10 tablet 0   budesonide -formoterol  (SYMBICORT ) 80-4.5 MCG/ACT inhaler Inhale 2 puffs into the lungs 2 (two) times daily. 1 each 5   No facility-administered medications prior to visit.    Review of Systems  Constitutional:  Negative for chills, fever, malaise/fatigue and weight loss.  HENT:  Negative for congestion, sinus pain and sore throat.   Eyes: Negative.   Respiratory:  Positive for cough and wheezing. Negative for hemoptysis, sputum production and shortness of breath.   Cardiovascular:  Negative for chest pain, palpitations, orthopnea, claudication and leg swelling.  Gastrointestinal:  Negative for abdominal pain, heartburn, nausea and vomiting.  Genitourinary: Negative.   Musculoskeletal:  Negative for joint pain and myalgias.  Skin:  Negative for rash.  Neurological:  Negative for weakness.  Endo/Heme/Allergies: Negative.   Psychiatric/Behavioral: Negative.     Objective:   Vitals:   04/29/24 1102  BP: 132/84  Pulse: 66  SpO2: 100%  Weight: 145 lb (65.8 kg)    Physical Exam Constitutional:      General: She is not in acute distress.    Appearance: Normal appearance.  Eyes:     General: No scleral icterus.    Conjunctiva/sclera: Conjunctivae normal.  Cardiovascular:     Rate and Rhythm: Normal rate and regular rhythm.  Pulmonary:     Breath sounds: No wheezing, rhonchi or rales.  Musculoskeletal:     Right lower leg: No edema.     Left lower leg: No edema.  Skin:    General: Skin is warm and dry.  Neurological:     General: No focal deficit present.    CBC    Component Value Date/Time   WBC 3.5 (L) 03/02/2022 1030   RBC 4.31 03/02/2022 1030   HGB 13.3 03/02/2022 1030   HGB 13.2 02/04/2020 1620   HCT 41.4 03/02/2022 1030   HCT 38.5 02/04/2020 1620   PLT 170 03/02/2022 1030   PLT 183 02/04/2020 1620   MCV 96.1 03/02/2022 1030   MCV 92 02/04/2020 1620   MCH 30.9 03/02/2022 1030    MCHC 32.1 03/02/2022 1030   RDW 11.9 03/02/2022 1030   RDW 11.6 (L) 02/04/2020 1620   LYMPHSABS 1,435 04/30/2016 1200   MONOABS 420 04/30/2016 1200   EOSABS 35 04/30/2016 1200   BASOSABS 35 04/30/2016 1200      Latest Ref Rng & Units 03/02/2022   10:30 AM 10/11/2021    3:20  PM 03/01/2021   11:10 AM  BMP  Glucose 70 - 99 mg/dL 96  83  92   BUN 8 - 23 mg/dL 21  18  21    Creatinine 0.44 - 1.00 mg/dL 9.30  9.27  9.27   Sodium 135 - 145 mmol/L 141  138  137   Potassium 3.5 - 5.1 mmol/L 3.7  3.7  3.7   Chloride 98 - 111 mmol/L 105  100  100   CO2 22 - 32 mmol/L 29  31  29    Calcium 8.9 - 10.3 mg/dL 9.4  9.7  9.7    Chest imaging: CXR  10/04/23 Cardiomediastinal silhouette and pulmonary vasculature are within normal limits.  PFT:    Latest Ref Rng & Units 04/28/2024   10:50 AM  PFT Results  FVC-Pre L 2.19  P  FVC-Predicted Pre % 83  P  FVC-Post L 2.21  P  FVC-Predicted Post % 84  P  Pre FEV1/FVC % % 80  P  Post FEV1/FCV % % 81  P  FEV1-Pre L 1.76  P  FEV1-Predicted Pre % 88  P  FEV1-Post L 1.80  P  DLCO uncorrected ml/min/mmHg 16.41  P  DLCO UNC% % 95  P  DLVA Predicted % 108  P  TLC L 3.83  P  TLC % Predicted % 86  P  RV % Predicted % 90  P    P Preliminary result  PFT 04/28/24 is within normal limits.  Labs:  Path:  Echo:  Heart Catheterization:    Assessment & Plan:   Mild intermittent asthma without complication  Periodic heart flutter - Plan: Ambulatory referral to Cardiology  Chronic cough - Plan: budesonide -formoterol  (SYMBICORT ) 80-4.5 MCG/ACT inhaler  Wheezing - Plan: budesonide -formoterol  (SYMBICORT ) 80-4.5 MCG/ACT inhaler  Discussion: Telisha Zawadzki is a 68 year old woman, never smoker with hypertension who is referred to pulmonary clinic for chronic cough.   Mild Intermittent Asthma/Reactive airway disease Likely post-covid 19 reactive airways disease vs cough variant asthma. Improvement with Symbicort . Reflux considered as a factor. PFTs are  within normal limits - Use Symbicort  as needed or one puff twice daily to minimize throat irritation. - Rinse mouth after inhaler use. - Consider wedge pillow or bed elevation to reduce reflux symptoms. - Avoid eating 2 hours before lying down.  History of COVID-19 Lingering cough likely due to increased airway sensitivity post-infection.  Swallowing issues Intermittent sensation of food sticking, possibly due to esophageal dysmotility or reflux. - Consider follow up with GI if swallowing issues worsen. - Consider swallow study if symptoms persist.  Heart fluttering Intermittent palpitations possibly related to exertion. - Refer to cardiology for evaluation and possible heart monitoring. - hx of hypertension  Follow up in 1 year, call sooner if needed.  Dorn Chill, MD [facility] Office: [phone]    Current Outpatient Medications:    ACCU-CHEK AVIVA PLUS test strip, Check readings once per week., Disp: 100 each, Rfl: 0   Accu-Chek Softclix Lancets lancets, Check reading once per week,, Disp: 100 each, Rfl: 0   albuterol  (VENTOLIN  HFA) 108 (90 Base) MCG/ACT inhaler, Inhale 2 puffs into the lungs every 6 (six) hours as needed for wheezing or shortness of breath., Disp: 8 g, Rfl: 6   Alcohol Swabs (B-D SINGLE USE SWABS REGULAR) PADS, , Disp: , Rfl:    Blood Glucose Monitoring Suppl (ACCU-CHEK AVIVA PLUS) w/Device KIT, , Disp: , Rfl:    Cholecalciferol (VITAMIN D3) 50 MCG (2000  UT) TABS, Take 2,000 Units by mouth in the morning., Disp: , Rfl:    irbesartan -hydrochlorothiazide  (AVALIDE) 300-12.5 MG tablet, TAKE 1 TABLET BY MOUTH DAILY, Disp: 90 tablet, Rfl: 0   traMADol  (ULTRAM ) 50 MG tablet, Take 1 tablet (50 mg total) by mouth every 6 (six) hours as needed (mild pain)., Disp: 10 tablet, Rfl: 0   budesonide -formoterol  (SYMBICORT ) 80-4.5 MCG/ACT inhaler, Inhale 2 puffs into the lungs 2 (two) times daily., Disp: 1 each, Rfl: 11

## 2024-04-29 NOTE — Patient Instructions (Addendum)
 Use symbicort  inhaler 1 puff daily - rinse mouth out after each use - use inhaler as needed if you start to experience scratchy throat issues again  Your breathing tests are normal  We will refer you to the cardiology team for your concern of heart flutters  Follow up in 1 year, call sooner if needed

## 2024-07-23 ENCOUNTER — Ambulatory Visit: Attending: Internal Medicine

## 2024-07-23 ENCOUNTER — Encounter: Payer: Self-pay | Admitting: Internal Medicine

## 2024-07-23 ENCOUNTER — Ambulatory Visit: Attending: Cardiology | Admitting: Internal Medicine

## 2024-07-23 VITALS — BP 136/84 | HR 76 | Ht 60.0 in | Wt 140.8 lb

## 2024-07-23 DIAGNOSIS — R002 Palpitations: Secondary | ICD-10-CM

## 2024-07-23 NOTE — Progress Notes (Unsigned)
 Enrolled patient for a 14 day Zio XT monitor to be mailed to patients home  [id number] mailed to patient/ applied in office on 07/27/24.

## 2024-07-23 NOTE — Progress Notes (Signed)
 OFFICE CONSULT NOTE  Chief Complaint:  Palpitations  Primary Care Physician: Rolinda Millman, MD  HPI:  [name] is a 68 y.o. female who is being seen today for the evaluation of palpitations at the request of Kara Dorn NOVAK, MD. this a pleasant 68 year old female kindly referred for evaluation management of palpitations by her pulmonologist.  She has a history of hypertension and had COVID several months ago.  She had worsening breathing issues after that and was started on Symbicort .  She has done well with that and may only be able to need to use it as needed.  She mention to her pulmonologist that she had had some worsening palpitations and was referred to cardiology for this.  The palpitations are intermittent but can occur at rest or with exertion.  They tend to last for short period of time and go away typically on their own.  She has had these episodes actually prior to having COVID as well.  No history of thyroid  abnormalities.  She denies any chest pain.  PMHx:  Past Medical History:  Diagnosis Date   Allergy    mild   Arthritis    L knee   Hyperlipidemia    Hypertension    on meds   Pre-diabetes    Prediabetes    no meds- diet controlled   Seizures (HCC)    seizures with pregnancy 33 yrs ago - none since   Seizures (HCC)    hx of 30 years ago   TBI (traumatic brain injury) (HCC)    Memory issues improved   Vitamin D  deficiency     Past Surgical History:  Procedure Laterality Date   ABDOMINAL HYSTERECTOMY     BSO   BREAST BIOPSY Left    COLONOSCOPY  01/13/2008   normal   KNEE ARTHROSCOPY Right    LAPAROSCOPIC APPENDECTOMY N/A 03/07/2022   Procedure: APPENDECTOMY LAPAROSCOPIC;  Surgeon: Debby Hila, MD;  Location: [facility];  Service: General;  Laterality: N/A;    FAMHx:  Family History  Problem Relation Age of Onset   Hypertension Mother    Diabetes Mother    Heart disease Father    Cancer Brother    Kidney disease Other    Colon cancer  Neg Hx    Colon polyps Neg Hx    Esophageal cancer Neg Hx    Rectal cancer Neg Hx    Stomach cancer Neg Hx     SOCHx:   reports that she has never smoked. She has never used smokeless tobacco. She reports that she does not drink alcohol and does not use drugs.  ALLERGIES:  No Known Allergies  ROS: Pertinent items noted in HPI and remainder of comprehensive ROS otherwise negative.  HOME MEDS: Current Outpatient Medications on File Prior to Visit  Medication Sig Dispense Refill   budesonide -formoterol  (SYMBICORT ) 80-4.5 MCG/ACT inhaler Inhale 2 puffs into the lungs 2 (two) times daily. 1 each 11   Cholecalciferol (VITAMIN D3) 50 MCG (2000 UT) TABS Take 2,000 Units by mouth in the morning.     irbesartan -hydrochlorothiazide  (AVALIDE) 300-12.5 MG tablet TAKE 1 TABLET BY MOUTH DAILY 90 tablet 0   No current facility-administered medications on file prior to visit.    LABS/IMAGING: No results found for this or any previous visit (from the past 48 hours). No results found.  LIPID PANEL:    Component Value Date/Time   CHOL 170 09/12/2020 1621   TRIG 90 09/12/2020 1621   HDL 67 09/12/2020  1621   CHOLHDL 2.5 09/12/2020 1621   CHOLHDL 2.1 04/30/2016 1200   VLDL 15 04/30/2016 1200   LDLCALC 87 09/12/2020 1621    WEIGHTS: Wt Readings from Last 3 Encounters:  07/23/24 140 lb 12.8 oz (63.9 kg)  04/29/24 145 lb (65.8 kg)  02/28/24 140 lb (63.5 kg)    VITALS: BP 136/84   Pulse 76   Ht 5' (1.524 m)   Wt 140 lb 12.8 oz (63.9 kg)   SpO2 96%   BMI 27.50 kg/m   EXAM: General appearance: alert and no distress Lungs: clear to auscultation bilaterally Heart: regular rate and rhythm, S1, S2 normal, no murmur, click, rub or gallop Extremities: extremities normal, atraumatic, no cyanosis or edema Neurologic: Grossly normal  EKG: EKG Interpretation Date/Time:  Thursday July 23 2024 11:24:26 EDT Ventricular Rate:  73 PR Interval:  174 QRS Duration:  84 QT  Interval:  384 QTC Calculation: 423 R Axis:   27  Text Interpretation: Normal sinus rhythm Nonspecific T wave abnormality When compared with ECG of 02-Mar-2022 10:34, No significant change was found Confirmed by Mona Kent [phone]) on 07/23/2024 11:42:58 AM  - personally reviewed  ASSESSMENT: Palpitations Hypertension  PLAN: 1.   [name] has recently been having some palpitations.  They occur somewhat sporadically.  She had 1 yesterday while driving.  I would like to place a 2-week monitor to see if we can pick up on them.  I am concerned that could be atrial fibrillation given her age and history of hypertension.  EKG showed sinus rhythm with some nonspecific T wave changes.  She denies any anginal symptoms.  She reports her breathing is much better after using the Symbicort .  She does not use any rescue albuterol  inhalers.  She tries to limit or avoid caffeine in her diet and get good sleep and reduce stress.  These could also worsen palpitations.  Will plan follow-up after her monitor likely in about 2 months.  Thanks again for the kind referral.  Kent KYM Mona, MD, [facility], FNLA, FACP  [place]  [facility]  Medical Director of the Advanced Lipid Disorders &  Cardiovascular Risk Reduction Clinic Diplomate of the American Board of Clinical Lipidology Attending Cardiologist  Direct Dial: [phone]  Fax: [phone]  Website:  [email or web address] Kent BROCKS Kamy Poinsett 07/23/2024, 12:01 PM

## 2024-07-23 NOTE — Patient Instructions (Addendum)
 Thank you for choosing [facility]!     Medication Instructions:  No medication changes were made during today's visit.  *If you need a refill on your cardiac medications before your next appointment, please call your pharmacy*   Lab Work: No labs were ordered during today's visit.  If you have labs (blood work) drawn today and your tests are completely normal, you will receive your results only by: MyChart Message (if you have MyChart) OR A paper copy in the mail If you have any lab test that is abnormal or we need to change your treatment, we will call you to review the results.   Testing/Procedures:  ZIO XT- Long Term Monitor Instructions   Your physician has requested you wear your ZIO patch monitor___14____days.   This is a single patch monitor.  [employer] supplies one patch monitor per enrollment.  Additional stickers are not available.   Please do not apply patch if you will be having a Nuclear Stress Test, Echocardiogram, Cardiac CT, MRI, or Chest Xray during the time frame you would be wearing the monitor. The patch cannot be worn during these tests.  You cannot remove and re-apply the ZIO XT patch monitor.   Your ZIO patch monitor will be sent USPS Priority mail from [facility] directly to your home address. The monitor may also be mailed to a PO BOX if home delivery is not available.   It may take 3-5 days to receive your monitor after you have been enrolled.   Once you have received you monitor, please review enclosed instructions.  Your monitor has already been registered assigning a specific monitor serial # to you.   Applying the monitor   Shave hair from upper left chest.   Hold abrader disc by orange tab.  Rub abrader in 40 strokes over left upper chest as indicated in your monitor instructions.   Clean area with 4 enclosed alcohol pads .  Use all pads to assure are is cleaned thoroughly.  Let dry.   Apply patch as indicated in monitor  instructions.  Patch will be place under collarbone on left side of chest with arrow pointing upward.   Rub patch adhesive wings for 2 minutes.Remove white label marked 1.  Remove white label marked 2.  Rub patch adhesive wings for 2 additional minutes.   While looking in a mirror, press and release button in center of patch.  A small green light will flash 3-4 times .  This will be your only indicator the monitor has been turned on.     Do not shower for the first 24 hours.  You may shower after the first 24 hours.   Press button if you feel a symptom. You will hear a small click.  Record Date, Time and Symptom in the Patient Log Book.   When you are ready to remove patch, follow instructions on last 2 pages of Patient Log Book.  Stick patch monitor onto last page of Patient Log Book.   Place Patient Log Book in [place] box.  Use locking tab on box and tape box closed securely.  The [employer] has [employer] on it.  Please place in mailbox as soon as possible.  Your physician should have your test results approximately 7 days after the monitor has been mailed back to [facility].   Call [facility] Customer Care at [phone] if you have questions regarding your ZIO XT patch monitor.  Call them immediately if you see  an orange light blinking on your monitor.   If your monitor falls off in less than 4 days contact our Monitor department at [phone].  If your monitor becomes loose or falls off after 4 days call [employer] at [phone] for suggestions on securing your monitor.    Your next appointment:   2 month(s)   Provider:   Dr. Mona or available APP   Follow-Up: At [facility], you and your health needs are our priority.  As part of our continuing mission to provide you with exceptional heart care, we have created designated Provider Care Teams.  These Care Teams include your primary Cardiologist (physician) and Advanced Practice Providers (APPs  -  Physician Assistants and Nurse Practitioners) who all work together to provide you with the care you need, when you need it. We recommend signing up for the patient portal called MyChart.  Sign up information is provided on this After Visit Summary.  MyChart is used to connect with patients for Virtual Visits (Telemedicine).  Patients are able to view lab/test results, encounter notes, upcoming appointments, etc.  Non-urgent messages can be sent to your provider as well.   To learn more about what you can do with MyChart, go to [email or web address].

## 2024-07-24 DIAGNOSIS — Z23 Encounter for immunization: Secondary | ICD-10-CM | POA: Diagnosis not present

## 2024-07-27 ENCOUNTER — Telehealth: Payer: Self-pay | Admitting: Internal Medicine

## 2024-07-27 ENCOUNTER — Ambulatory Visit: Attending: Cardiology

## 2024-07-27 NOTE — Telephone Encounter (Signed)
 Patient will come in today 10/13 around 1230 to have monitor applied

## 2024-07-27 NOTE — Telephone Encounter (Signed)
  1. Is this related to a heart monitor you are wearing?  (If the patient says no, please ask     if they are caling about ICD/pacemaker.) Heart Monitor  2. What is your issue?? Pt has received monitor in the mail and wants to come in to have it put on   Please route to covering RN/CMA/RMA for results. Route to monitor technicians or your monitor tech representative for your site for any technical concerns

## 2024-08-12 DIAGNOSIS — R002 Palpitations: Secondary | ICD-10-CM | POA: Diagnosis not present

## 2024-08-14 DIAGNOSIS — R002 Palpitations: Secondary | ICD-10-CM

## 2024-08-19 ENCOUNTER — Ambulatory Visit: Payer: Self-pay | Admitting: Internal Medicine

## 2024-09-08 DIAGNOSIS — Z780 Asymptomatic menopausal state: Secondary | ICD-10-CM | POA: Diagnosis not present

## 2024-09-25 ENCOUNTER — Ambulatory Visit: Attending: Internal Medicine | Admitting: Internal Medicine

## 2024-09-25 ENCOUNTER — Encounter: Payer: Self-pay | Admitting: Internal Medicine

## 2024-09-25 VITALS — BP 130/70 | HR 77 | Ht 63.0 in | Wt 138.0 lb

## 2024-09-25 DIAGNOSIS — R002 Palpitations: Secondary | ICD-10-CM | POA: Diagnosis not present

## 2024-09-25 DIAGNOSIS — I10 Essential (primary) hypertension: Secondary | ICD-10-CM | POA: Diagnosis not present

## 2024-09-25 NOTE — Progress Notes (Signed)
 OFFICE CONSULT NOTE  Chief Complaint:  Follow-up palpitations  Primary Care Physician: Rolinda Millman, MD  HPI:  [name] is a 68 y.o. female who is being seen today for the evaluation of palpitations at the request of Rolinda Millman, MD. this a pleasant 68 year old female kindly referred for evaluation management of palpitations by her pulmonologist.  She has a history of hypertension and had COVID several months ago.  She had worsening breathing issues after that and was started on Symbicort .  She has done well with that and may only be able to need to use it as needed.  She mention to her pulmonologist that she had had some worsening palpitations and was referred to cardiology for this.  The palpitations are intermittent but can occur at rest or with exertion.  They tend to last for short period of time and go away typically on their own.  She has had these episodes actually prior to having COVID as well.  No history of thyroid  abnormalities.  She denies any chest pain.  09/25/2024  [name] returns today for follow-up.  She was having worsening palpitations and her monitor showed runs of SVT but no A-fib.  I suggest that she could start low-dose metoprolol to suppress them.  She said that her palpitations have improved and thought it was related to stress but did not get better.  She was not interested in medicine at this time.  Blood pressure previously was elevated but does appear improved today 130/70.  PMHx:  Past Medical History:  Diagnosis Date   Allergy    mild   Arthritis    L knee   Hyperlipidemia    Hypertension    on meds   Pre-diabetes    Prediabetes    no meds- diet controlled   Seizures (HCC)    seizures with pregnancy 34 yrs ago - none since   Seizures (HCC)    hx of 30 years ago   TBI (traumatic brain injury) (HCC)    Memory issues improved   Vitamin D  deficiency     Past Surgical History:  Procedure Laterality Date   ABDOMINAL HYSTERECTOMY      BSO   BREAST BIOPSY Left    COLONOSCOPY  01/13/2008   normal   KNEE ARTHROSCOPY Right    LAPAROSCOPIC APPENDECTOMY N/A 03/07/2022   Procedure: APPENDECTOMY LAPAROSCOPIC;  Surgeon: Debby Hila, MD;  Location: [facility];  Service: General;  Laterality: N/A;    FAMHx:  Family History  Problem Relation Age of Onset   Hypertension Mother    Diabetes Mother    Heart disease Father    Cancer Brother    Kidney disease Other    Colon cancer Neg Hx    Colon polyps Neg Hx    Esophageal cancer Neg Hx    Rectal cancer Neg Hx    Stomach cancer Neg Hx     SOCHx:   reports that she has never smoked. She has never used smokeless tobacco. She reports that she does not drink alcohol and does not use drugs.  ALLERGIES:  No Known Allergies  ROS: Pertinent items noted in HPI and remainder of comprehensive ROS otherwise negative.  HOME MEDS: Current Outpatient Medications on File Prior to Visit  Medication Sig Dispense Refill   budesonide -formoterol  (SYMBICORT ) 80-4.5 MCG/ACT inhaler Inhale 2 puffs into the lungs 2 (two) times daily. 1 each 11   Cholecalciferol (VITAMIN D3) 50 MCG (2000 UT) TABS Take 2,000 Units by mouth  in the morning.     irbesartan -hydrochlorothiazide  (AVALIDE) 300-12.5 MG tablet TAKE 1 TABLET BY MOUTH DAILY 90 tablet 0   No current facility-administered medications on file prior to visit.    LABS/IMAGING: No results found for this or any previous visit (from the past 48 hours). No results found.  LIPID PANEL:    Component Value Date/Time   CHOL 170 09/12/2020 1621   TRIG 90 09/12/2020 1621   HDL 67 09/12/2020 1621   CHOLHDL 2.5 09/12/2020 1621   CHOLHDL 2.1 04/30/2016 1200   VLDL 15 04/30/2016 1200   LDLCALC 87 09/12/2020 1621    WEIGHTS: Wt Readings from Last 3 Encounters:  09/25/24 138 lb (62.6 kg)  07/23/24 140 lb 12.8 oz (63.9 kg)  04/29/24 145 lb (65.8 kg)    VITALS: BP 130/70   Pulse 77   Ht 5' 3 (1.6 m)   Wt 138 lb (62.6 kg)   SpO2 98%    BMI 24.45 kg/m   EXAM: General appearance: alert and no distress Lungs: clear to auscultation bilaterally Heart: regular rate and rhythm, S1, S2 normal, no murmur, click, rub or gallop Extremities: extremities normal, atraumatic, no cyanosis or edema Neurologic: Grossly normal  EKG: Deferred  ASSESSMENT: Palpitations - PSVT Hypertension  PLAN: 1.   Ms. Cashwell had palpitations but they have improved.  It may have been related to stress.  Monitor showed PSVT but no A-fib.  I did offer beta-blocker if she became symptomatic but she declined.  She could always contact us  or her PCP if she has some reoccurrence.  Blood pressure appears improved today.  I would continue her current medications.  Follow-up with me as needed.  Vinie KYM Maxcy, MD, [facility], FNLA, FACP  [place]  [facility]  Medical Director of the [facility]  [facility] Diplomate of the American Board of Clinical Lipidology Attending Cardiologist  Direct Dial: [phone]  Fax: [phone]  Website:  [email or web address] Vinie BROCKS Jakeria Caissie 09/25/2024, 9:28 AM

## 2024-09-25 NOTE — Patient Instructions (Signed)
 Continue current medications.   Follow up as needed with Dr. Mona   Happy Holidays!

## 2024-10-29 ENCOUNTER — Other Ambulatory Visit: Payer: Self-pay | Admitting: Family Medicine

## 2024-10-29 DIAGNOSIS — Z1231 Encounter for screening mammogram for malignant neoplasm of breast: Secondary | ICD-10-CM

## 2024-10-30 ENCOUNTER — Other Ambulatory Visit: Payer: Self-pay | Admitting: Family Medicine

## 2024-10-30 DIAGNOSIS — R519 Headache, unspecified: Secondary | ICD-10-CM

## 2024-11-06 ENCOUNTER — Ambulatory Visit
Admission: RE | Admit: 2024-11-06 | Discharge: 2024-11-06 | Disposition: A | Source: Ambulatory Visit | Attending: Family Medicine

## 2024-11-06 DIAGNOSIS — R519 Headache, unspecified: Secondary | ICD-10-CM

## 2024-11-06 MED ORDER — IOPAMIDOL (ISOVUE-370) INJECTION 76%
60.0000 mL | Freq: Once | INTRAVENOUS | Status: AC | PRN
  Administered 2024-11-06: 60 mL via INTRAVENOUS

## 2024-11-20 ENCOUNTER — Other Ambulatory Visit: Payer: Self-pay

## 2024-11-20 DIAGNOSIS — D709 Neutropenia, unspecified: Secondary | ICD-10-CM

## 2024-11-23 ENCOUNTER — Inpatient Hospital Stay: Admitting: Internal Medicine

## 2024-11-23 ENCOUNTER — Inpatient Hospital Stay

## 2025-03-11 ENCOUNTER — Ambulatory Visit
